# Patient Record
Sex: Male | Born: 1960 | Race: White | Hispanic: No | Marital: Married | Smoking: Current every day smoker
Health system: Southern US, Community
[De-identification: ages and names within clinical notes are randomized; demographics above are authoritative.]

## PROBLEM LIST (undated history)

## (undated) DIAGNOSIS — R790 Abnormal level of blood mineral: Secondary | ICD-10-CM

## (undated) DIAGNOSIS — Z720 Tobacco use: Secondary | ICD-10-CM

## (undated) DIAGNOSIS — C801 Malignant (primary) neoplasm, unspecified: Secondary | ICD-10-CM

## (undated) HISTORY — DX: Tobacco use: Z72.0

## (undated) HISTORY — PX: SURGERY SCROTAL / TESTICULAR: SUR1316

---

## 2001-04-19 ENCOUNTER — Emergency Department (HOSPITAL_COMMUNITY): Admission: EM | Admit: 2001-04-19 | Discharge: 2001-04-19 | Payer: Self-pay

## 2011-10-24 ENCOUNTER — Ambulatory Visit: Payer: BC Managed Care – PPO | Admitting: Internal Medicine

## 2011-10-24 VITALS — BP 118/77 | HR 99 | Temp 98.7°F | Resp 16 | Ht 67.0 in | Wt 130.4 lb

## 2011-10-24 DIAGNOSIS — M545 Low back pain, unspecified: Secondary | ICD-10-CM

## 2011-10-24 MED ORDER — PREDNISONE 10 MG PO TABS: ORAL_TABLET | ORAL | Status: DC

## 2011-10-24 MED ORDER — CYCLOBENZAPRINE HCL 10 MG PO TABS: 10.0000 mg | ORAL_TABLET | Freq: Every day | ORAL | Status: AC

## 2011-10-24 MED ORDER — MELOXICAM 15 MG PO TABS: 15.0000 mg | ORAL_TABLET | Freq: Every day | ORAL | Status: DC

## 2011-10-24 NOTE — Patient Instructions (Signed)
Take medicines and do exercises, handout No lifting over 15-20 pounds at work for the next 2 weeks If not well in 2-3 weeks return for further evaluation

## 2011-10-25 ENCOUNTER — Telehealth: Payer: Self-pay

## 2011-10-25 MED ORDER — HYDROCODONE-ACETAMINOPHEN 5-325 MG PO TABS: 1.0000 | ORAL_TABLET | Freq: Three times a day (TID) | ORAL | Status: AC | PRN

## 2011-10-25 NOTE — Telephone Encounter (Signed)
Patients wife notified, and rx called into [facility]-

## 2011-10-25 NOTE — Telephone Encounter (Signed)
pts wife Personnel officer) called states this is their 3rd call today. States husband is in extreme pain and meds are not helping. Please call pt at [phone]

## 2011-10-25 NOTE — Telephone Encounter (Signed)
pts wife states he is worse today, unable to get out of bed.  Would like someone to call to discuss options.  Medication is not working. Saw Dr. Merla Riches yesterday.  [phone]

## 2011-10-25 NOTE — Progress Notes (Signed)
  Subjective:    Patient ID: Timothy Wade, male    DOB: [birth year], 51 y.o.   MRN: [id number]  HPIA 2 week history of low back pain radiating into the left leg upper posterior thigh No numbness or weakness No prior history of injury Works in custodial position Sleeps well but pain is on arising    Review of SystemsNo illnesses or medications No GU symptoms     Objective:   Physical Exam Vital signs stable He is mildly tender to palpation over the left lumbar sacral muscles  But not over the spinous processes Straight leg raise is positive on the left at 90/negative on the right DTRs are 2+ and symmetrical in the lower Extremities No sensory or motor losses       Assessment & Plan:  Acute lumbosacral strain with radicular symptoms  Heat Stretch/exercises given Flexeril at bedtime Mobic 15 mg in the a.m. Six-day course of prednisone Avoid reinjury at work Recheck in 2 weeks if not well

## 2011-10-25 NOTE — Telephone Encounter (Signed)
A couple of things pts can do.  Increase the flexeril to tid to help with muscle spasms.  Will also send in some pain meds for the next couple of days as the prednisone starts to work.

## 2011-10-28 ENCOUNTER — Encounter: Payer: Self-pay | Admitting: Family Medicine

## 2011-10-28 ENCOUNTER — Ambulatory Visit: Payer: BC Managed Care – PPO | Admitting: Family Medicine

## 2011-10-28 ENCOUNTER — Ambulatory Visit: Payer: BC Managed Care – PPO

## 2011-10-28 VITALS — BP 121/76 | HR 94 | Temp 97.4°F | Resp 16 | Ht 67.0 in | Wt 132.0 lb

## 2011-10-28 DIAGNOSIS — M549 Dorsalgia, unspecified: Secondary | ICD-10-CM

## 2011-10-28 DIAGNOSIS — M5417 Radiculopathy, lumbosacral region: Secondary | ICD-10-CM

## 2011-10-28 DIAGNOSIS — IMO0002 Reserved for concepts with insufficient information to code with codable children: Secondary | ICD-10-CM

## 2011-10-28 MED ORDER — OXYCODONE-ACETAMINOPHEN 5-325 MG PO TABS: 1.0000 | ORAL_TABLET | Freq: Three times a day (TID) | ORAL | Status: AC | PRN

## 2011-10-28 MED ORDER — METHOCARBAMOL 500 MG PO TABS: 500.0000 mg | ORAL_TABLET | Freq: Three times a day (TID) | ORAL | Status: AC

## 2011-10-28 MED ORDER — MELOXICAM 15 MG PO TABS: 15.0000 mg | ORAL_TABLET | Freq: Every day | ORAL | Status: AC

## 2011-10-28 MED ORDER — METHYLPREDNISOLONE 4 MG PO KIT: PACK | ORAL | Status: AC

## 2011-10-28 MED ORDER — TRAMADOL HCL 50 MG PO TABS: 50.0000 mg | ORAL_TABLET | Freq: Three times a day (TID) | ORAL | Status: AC | PRN

## 2011-10-28 NOTE — Progress Notes (Signed)
[facility]:  Office Visit  Chief Complaint:  Chief Complaint  Patient presents with  . Back Pain    low back pain goes into left buttock and left leg x 2 weeks    HPI: Timothy Wade is a 51 y.o. male who complains of  3 week h/o 8-10/10 sharp & throbbing left leg pain associated with numbness/tingling starting starting from left buttock radiating down posterior thigh stopping at knee x 3 weeks. Used to be intermittent, now more constant. Sitting makes it worse, laying down makes it better but he cannot do it for very long. He also feels better when he hunches forward.  No known trauma/injury. Constant tingling. Tried prednsione 30 mg daily without relief since Sunday ( 4 days). Also on Mobic and flexeril. No motor weakness. NO loss of bowel or bladder function/sensation  Past Medical History  Diagnosis Date  . Tobacco abuse    Past Surgical History  Procedure Date  . Surgery scrotal / testicular    History   Social History  . Marital Status: Married    Spouse Name: N/A    Number of Children: N/A  . Years of Education: N/A   Social History Main Topics  . Smoking status: Current Everyday Smoker -- 2.0 packs/day for 30 years  . Smokeless tobacco: None  . Alcohol Use: No  . Drug Use: No  . Sexually Active: None   Other Topics Concern  . None   Social History Narrative  . None   No family history on file. No Known Allergies Prior to Admission medications   Medication Sig Start Date End Date Taking? Authorizing Provider  cyclobenzaprine (FLEXERIL) 10 MG tablet Take 1 tablet (10 mg total) by mouth at bedtime. 10/24/11 11/03/11 Yes Tonye Pearson, MD  HYDROcodone-acetaminophen (NORCO) 5-325 MG per tablet Take 1 tablet by mouth every 8 (eight) hours as needed for pain. 10/25/11 11/04/11 Yes Sarah Harvie Bridge, PA-C  ibuprofen (ADVIL,MOTRIN) 200 MG tablet Take 200 mg by mouth every 6 (six) hours as needed.   Yes Historical Provider, MD  meloxicam (MOBIC) 15 MG  tablet Take 1 tablet (15 mg total) by mouth daily. 10/24/11 10/23/12 Yes Tonye Pearson, MD  predniSONE (DELTASONE) 10 MG tablet 3/3/2/2/1/1Single daily dose for 6 day 10/24/11  Yes Tonye Pearson, MD     ROS: The patient denies fevers, chills, night sweats, unintentional weight loss, chest pain, palpitations, wheezing, dyspnea on exertion, nausea, vomiting, abdominal pain, dysuria, hematuria, melena, weakness,  + pain, numbness,  tingling.   All other systems have been reviewed and were otherwise negative with the exception of those mentioned in the HPI and as above.    PHYSICAL EXAM: Filed Vitals:   10/28/11 1802  BP: 121/76  Pulse: 94  Temp: 97.4 F (36.3 C)  Resp: 16   Filed Vitals:   10/28/11 1802  Height: 5\' 7"  (1.702 m)  Weight: 132 lb (59.875 kg)   Body mass index is 20.67 kg/(m^2).  General: Alert, mild acute distress, thin white male  HEENT:  Normocephalic, atraumatic, oropharynx patent.  Cardiovascular:  Regular rate and rhythm, no rubs murmurs or gallops.  No Carotid bruits, radial pulse intact. No pedal edema.  Respiratory: Clear to auscultation bilaterally.  No rales, or rhonchi.  No cyanosis, no use of accessory musculature. + wheeze GI: No organomegaly, abdomen is soft and non-tender, positive bowel sounds.  No masses. Skin: No rashes. Neurologic: Facial musculature symmetric. Psychiatric: Patient is appropriate throughout our interaction.  Lymphatic: No cervical lymphadenopathy Musculoskeletal: Gait slowed due to pain, no hip abnormalities Lumbar spine: Tender at buttock, no appreciable atrophy + pain with AROM/PROM ( so exam limited) patient cannot ben in extension or flexion > 45 degrees), rotation and SB is limited as well due to pain Strength intact 5/5 + 2 knee, ankle reflex, babinski nl Sensation dull left leg + straight leg on left   LABS: No results found for this or any previous visit.   EKG/XRAY:   Primary read interpreted by Dr. Conley Rolls at  [facility] DJD of low back   ASSESSMENT/PLAN: Encounter Diagnoses  Name Primary?  . Back pain Yes  . Radicular pain of lumbosacral region    ? Herniated disc vs piriformis syndrome vs sciatica Refer patient to Kern Alberta MD ( if patient has no improvement in 2 weeks he will call Dr. Maurice Small himself for appt, if he needs referrral he can call us for it)  Rx Medrol Dose pack , Tramadol, Robaxen, Percocet #21 ( patient aware that I only give 1 time Percocet due to addiction potential, he agrees to use it sparingly, will use Tramadol and MObic for pain control) Stop Flexeril, stop Prednisone since low dose, stop hydrocodone ( I actually took his leftover Hydrocodone in exchange for the Percocet to dipose)     Haylen Shelnutt PHUONG, DO 10/28/2011 6:44 PM

## 2018-06-16 ENCOUNTER — Observation Stay (HOSPITAL_COMMUNITY): Payer: Managed Care, Other (non HMO)

## 2018-06-16 ENCOUNTER — Inpatient Hospital Stay (HOSPITAL_COMMUNITY)
Admission: EM | Admit: 2018-06-16 | Discharge: 2018-06-20 | DRG: 477 | Disposition: A | Discharge status: Home / Self Care | Payer: Managed Care, Other (non HMO) | Attending: Internal Medicine | Admitting: Internal Medicine

## 2018-06-16 ENCOUNTER — Encounter (HOSPITAL_COMMUNITY): Payer: Self-pay | Admitting: *Deleted

## 2018-06-16 DIAGNOSIS — J811 Chronic pulmonary edema: Secondary | ICD-10-CM | POA: Diagnosis present

## 2018-06-16 DIAGNOSIS — M542 Cervicalgia: Secondary | ICD-10-CM | POA: Diagnosis not present

## 2018-06-16 DIAGNOSIS — C7951 Secondary malignant neoplasm of bone: Secondary | ICD-10-CM | POA: Diagnosis not present

## 2018-06-16 DIAGNOSIS — J9601 Acute respiratory failure with hypoxia: Secondary | ICD-10-CM | POA: Diagnosis not present

## 2018-06-16 DIAGNOSIS — F17210 Nicotine dependence, cigarettes, uncomplicated: Secondary | ICD-10-CM | POA: Diagnosis present

## 2018-06-16 DIAGNOSIS — C799 Secondary malignant neoplasm of unspecified site: Secondary | ICD-10-CM | POA: Diagnosis present

## 2018-06-16 DIAGNOSIS — C3490 Malignant neoplasm of unspecified part of unspecified bronchus or lung: Secondary | ICD-10-CM | POA: Diagnosis present

## 2018-06-16 DIAGNOSIS — IMO0002 Reserved for concepts with insufficient information to code with codable children: Secondary | ICD-10-CM

## 2018-06-16 DIAGNOSIS — J439 Emphysema, unspecified: Secondary | ICD-10-CM | POA: Diagnosis present

## 2018-06-16 DIAGNOSIS — Z807 Family history of other malignant neoplasms of lymphoid, hematopoietic and related tissues: Secondary | ICD-10-CM

## 2018-06-16 DIAGNOSIS — R229 Localized swelling, mass and lump, unspecified: Secondary | ICD-10-CM

## 2018-06-16 DIAGNOSIS — R0902 Hypoxemia: Secondary | ICD-10-CM

## 2018-06-16 DIAGNOSIS — R221 Localized swelling, mass and lump, neck: Secondary | ICD-10-CM | POA: Diagnosis present

## 2018-06-16 DIAGNOSIS — Z79899 Other long term (current) drug therapy: Secondary | ICD-10-CM

## 2018-06-16 HISTORY — DX: Abnormal level of blood mineral: R79.0

## 2018-06-16 LAB — BASIC METABOLIC PANEL
Anion gap: 7 (ref 5–15)
BUN: 18 mg/dL (ref 6–20)
CHLORIDE: 101 mmol/L (ref 98–111)
CO2: 26 mmol/L (ref 22–32)
Calcium: 9.5 mg/dL (ref 8.9–10.3)
Creatinine, Ser: 0.95 mg/dL (ref 0.61–1.24)
GFR calc Af Amer: >60 mL/min (ref >60)
GFR calc non Af Amer: >60 mL/min (ref >60)
Glucose, Bld: 96 mg/dL (ref 70–99)
POTASSIUM: 3.1 mmol/L — AB (ref 3.5–5.1)
SODIUM: 134 mmol/L — AB (ref 135–145)

## 2018-06-16 LAB — I-STAT CHEM 8, ED
BUN: 21 mg/dL — AB (ref 6–20)
CHLORIDE: 101 mmol/L (ref 98–111)
CREATININE: 0.8 mg/dL (ref 0.61–1.24)
Calcium, Ion: 1.24 mmol/L (ref 1.15–1.40)
Glucose, Bld: 90 mg/dL (ref 70–99)
HCT: 41 % (ref 39.0–52.0)
Hemoglobin: 13.9 g/dL (ref 13.0–17.0)
POTASSIUM: 3.2 mmol/L — AB (ref 3.5–5.1)
Sodium: 139 mmol/L (ref 135–145)
TCO2: 29 mmol/L (ref 22–32)

## 2018-06-16 LAB — CBC WITH DIFFERENTIAL/PLATELET
ABS IMMATURE GRANULOCYTES: 0.04 10*3/uL (ref 0.00–0.07)
Basophils Absolute: 0.1 10*3/uL (ref 0.0–0.1)
Basophils Relative: 1 %
Eosinophils Absolute: 0.5 10*3/uL (ref 0.0–0.5)
Eosinophils Relative: 5 %
HEMATOCRIT: 42.6 % (ref 39.0–52.0)
HEMOGLOBIN: 14.1 g/dL (ref 13.0–17.0)
IMMATURE GRANULOCYTES: 0 %
LYMPHS ABS: 1.8 10*3/uL (ref 0.7–4.0)
LYMPHS PCT: 19 %
MCH: 31.4 pg (ref 26.0–34.0)
MCHC: 33.1 g/dL (ref 30.0–36.0)
MCV: 94.9 fL (ref 80.0–100.0)
MONOS PCT: 12 %
Monocytes Absolute: 1.1 10*3/uL — ABNORMAL HIGH (ref 0.1–1.0)
NEUTROS ABS: 6.1 10*3/uL (ref 1.7–7.7)
NEUTROS PCT: 63 %
PLATELETS: 285 10*3/uL (ref 150–400)
RBC: 4.49 MIL/uL (ref 4.22–5.81)
RDW: 12.6 % (ref 11.5–15.5)
WBC: 9.7 10*3/uL (ref 4.0–10.5)
nRBC: 0 % (ref 0.0–0.2)

## 2018-06-16 MED ORDER — IOHEXOL 300 MG/ML  SOLN
100.0000 mL | Freq: Once | INTRAMUSCULAR | Status: AC | PRN
  Administered 2018-06-16: 100 mL via INTRAVENOUS

## 2018-06-16 NOTE — ED Notes (Signed)
Neuro surgery at bedside.

## 2018-06-16 NOTE — Consult Note (Signed)
Reason for Consult: Neck pain Referring Physician: Emergency department  Timothy Wade is an 57 y.o. male.  HPI: Patient is a 57 year old male who has had difficulty with progressive neck pain.  The patient has no history of trauma.  He has no history of ongoing illness or infection.  The patient's symptoms have been ongoing for the past few weeks.  He has no symptoms of radiating numbness paresthesias or weakness.  He has some mild feelings of dizziness which he attributes to some of the medications he is taking.  He has no lower cranial nerve dysfunction.  He has no known history of malignancy.  He is a smoker.  He has no recent weight loss.  He has no history of fevers chills.  Past Medical History:  Diagnosis Date  . Low magnesium level   . Tobacco abuse     Past Surgical History:  Procedure Laterality Date  . SURGERY SCROTAL / TESTICULAR      History reviewed. No pertinent family history.  Social History:  reports that he has been smoking. He has a 60.00 pack-year smoking history. He does not have any smokeless tobacco history on file. He reports that he does not drink alcohol or use drugs.  Allergies: No Known Allergies  Medications: I have reviewed the patient's current medications.  Results for orders placed or performed during the hospital encounter of 06/16/18 (from the past 48 hour(s))  CBC with Differential     Status: Abnormal   Collection Time: 06/16/18  8:49 PM  Result Value Ref Range   WBC 9.7 4.0 - 10.5 K/uL   RBC 4.49 4.22 - 5.81 MIL/uL   Hemoglobin 14.1 13.0 - 17.0 g/dL   HCT 42.6 39.0 - 52.0 %   MCV 94.9 80.0 - 100.0 fL   MCH 31.4 26.0 - 34.0 pg   MCHC 33.1 30.0 - 36.0 g/dL   RDW 12.6 11.5 - 15.5 %   Platelets 285 150 - 400 K/uL   nRBC 0.0 0.0 - 0.2 %   Neutrophils Relative % 63 %   Neutro Abs 6.1 1.7 - 7.7 K/uL   Lymphocytes Relative 19 %   Lymphs Abs 1.8 0.7 - 4.0 K/uL   Monocytes Relative 12 %   Monocytes Absolute 1.1 (H) 0.1 - 1.0 K/uL   Eosinophils  Relative 5 %   Eosinophils Absolute 0.5 0.0 - 0.5 K/uL   Basophils Relative 1 %   Basophils Absolute 0.1 0.0 - 0.1 K/uL   Immature Granulocytes 0 %   Abs Immature Granulocytes 0.04 0.00 - 0.07 K/uL    Comment: Performed at [facility], [address], [place], [place] [address]  I-Stat Chem 8, ED     Status: Abnormal   Collection Time: 06/16/18  9:00 PM  Result Value Ref Range   Sodium 139 135 - 145 mmol/L   Potassium 3.2 (L) 3.5 - 5.1 mmol/L   Chloride 101 98 - 111 mmol/L   BUN 21 (H) 6 - 20 mg/dL   Creatinine, Ser 0.80 0.61 - 1.24 mg/dL   Glucose, Bld 90 70 - 99 mg/dL   Calcium, Ion 1.24 1.15 - 1.40 mmol/L   TCO2 29 22 - 32 mmol/L   Hemoglobin 13.9 13.0 - 17.0 g/dL   HCT 41.0 39.0 - 52.0 %    No results found.  Pertinent items noted in HPI and remainder of comprehensive ROS otherwise negative. Blood pressure (!) 139/92, pulse 96, temperature 97.8 F (36.6 C), temperature source Oral, resp.  rate 16, SpO2 96 %. Patient is awake and alert.  He is oriented and appropriate.  Speech is fluent.  Judgment and insight are intact.  Cranial nerve function is normal bilaterally.  No evidence of cerebellar dysfunction.  Motor examination of the extremities normal.  Reflexes normal.  Examination of his head ears eyes no throats unremarkable.  No evidence of pharyngeal mass.  Examination of his neck reveals upper cervical tenderness.  There is no gross bony abnormality.  Chest and abdomen are benign.  Assessment/Plan: Destructive lesion involving C2 and C3.  Imaging most consistent with neoplastic disease although atypical infection is certainly a possibility.  Patient has no evidence of spinal cord compression.  Overall vertebral alignment is reasonably normal.  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  It may also represent metastatic disease.  I think the patient should be evaluated for possible multiple myeloma with serum and urine electrophoresis.  He should  also have a CT scan of his neck chest and abdomen to evaluate for possible metastatic disease or other bony lesions.  No indications for urgent surgical intervention.  If this does represent a plasmacytoma then radiation treatment would be the primary treatment at present.  Timothy Wade Day 06/16/2018, 9:05 PM

## 2018-06-16 NOTE — ED Triage Notes (Signed)
Pt reports having neck pain for months, has numbness/tingling to right side. Had xray done, then sent for MRI today. Was called by pcp this evening to come to ed for admission by dr Trenton Gammon. Was told he has "c2 cartilage destruction". Has c collar in place pta.

## 2018-06-16 NOTE — ED Notes (Signed)
The pt reports that he has had neck pain for one month.  He has a soft c-collar in place no new injuries

## 2018-06-16 NOTE — ED Provider Notes (Signed)
[facility] EMERGENCY DEPARTMENT Provider Note   CSN: [id number] Arrival date & time: 06/16/18  1853     History   Chief Complaint Chief Complaint  Patient presents with  . Neck Pain    HPI Timothy Wade is a 57 y.o. male.  57 yo M with a chief complaint of neck pain.  This is been going on for months.  He started having tingling to his right lateral leg and so he went to his family doctor who obtained an MRI.  That showed a destructive process at C2 and C3.  There is some extrusion into the epidural space.  He was then sent here by his family doctor for evaluation by neurosurgery.  The history is provided by the patient.  Neck Pain   This is a new problem. The current episode started 2 days ago. The problem occurs constantly. The problem has not changed since onset.The pain is associated with nothing. There has been no fever. Pain location: lower neck. The quality of the pain is described as stabbing and shooting. The pain does not radiate. The pain is at a severity of 7/10. The pain is moderate. Associated symptoms include numbness (to the lateral aspect of the right leg). Pertinent negatives include no chest pain and no headaches. He has tried neck support for the symptoms. The treatment provided no relief.    Past Medical History:  Diagnosis Date  . Low magnesium level   . Tobacco abuse     Patient Active Problem List   Diagnosis Date Noted  . Neck pain 06/16/2018    Past Surgical History:  Procedure Laterality Date  . SURGERY SCROTAL / TESTICULAR          Home Medications    Prior to Admission medications   Medication Sig Start Date End Date Taking? Authorizing Provider  cyclobenzaprine (FLEXERIL) 10 MG tablet Take 10 mg by mouth every 12 (twelve) hours as needed for muscle spasms.   Yes [provider]  magnesium oxide (MAG-OX) 400 MG tablet Take 400 mg by mouth daily.   Yes [provider]  naproxen (NAPROSYN) 500 MG tablet  Take 500 mg by mouth every 12 (twelve) hours as needed for mild pain.   Yes [provider]  predniSONE (DELTASONE) 10 MG tablet 3/3/2/2/1/1Single daily dose for 6 day Patient not taking: Reported on 06/16/2018 10/24/11   Leandrew Koyanagi, MD    Family History History reviewed. No pertinent family history.  Social History Social History   Tobacco Use  . Smoking status: Current Every Day Smoker    Packs/day: 2.00    Years: 30.00    Pack years: 60.00  Substance Use Topics  . Alcohol use: No  . Drug use: No     Allergies   Patient has no known allergies.   Review of Systems Review of Systems  Constitutional: Negative for chills and fever.  HENT: Negative for congestion and facial swelling.   Eyes: Negative for discharge and visual disturbance.  Respiratory: Negative for shortness of breath.   Cardiovascular: Negative for chest pain and palpitations.  Gastrointestinal: Negative for abdominal pain, diarrhea and vomiting.  Musculoskeletal: Positive for neck pain. Negative for arthralgias and myalgias.  Skin: Negative for color change and rash.  Neurological: Positive for numbness (to the lateral aspect of the right leg). Negative for tremors, syncope and headaches.  Psychiatric/Behavioral: Negative for confusion and dysphoric mood.     Physical Exam Updated Vital Signs BP (!) 139/92 (BP  Location: Right Arm)   Pulse 96   Temp 97.8 F (36.6 C) (Oral)   Resp 16   SpO2 96%   Physical Exam  Constitutional: He is oriented to person, place, and time. He appears well-developed and well-nourished.  HENT:  Head: Normocephalic and atraumatic.  Eyes: Pupils are equal, round, and reactive to light. EOM are normal.  Neck: Normal range of motion. Neck supple. No JVD present.  Cardiovascular: Normal rate and regular rhythm. Exam reveals no gallop and no friction rub.  No murmur heard. Pulmonary/Chest: No respiratory distress. He has no wheezes.  Abdominal: He exhibits  no distension and no mass. There is no tenderness. There is no rebound and no guarding.  Musculoskeletal: Normal range of motion.  Pulse motor and sensation is intact to bilateral upper extremities.  He has intact strength.  No lower extremity weakness reflexes are 2+ and equal bilaterally.  Neurological: He is alert and oriented to person, place, and time.  Skin: No rash noted. No pallor.  Psychiatric: He has a normal mood and affect. His behavior is normal.  Nursing note and vitals reviewed.    ED Treatments / Results  Labs (all labs ordered are listed, but only abnormal results are displayed) Labs Reviewed  CBC WITH DIFFERENTIAL/PLATELET - Abnormal; Notable for the following components:      Result Value   Monocytes Absolute 1.1 (*)    All other components within normal limits  BASIC METABOLIC PANEL - Abnormal; Notable for the following components:   Sodium 134 (*)    Potassium 3.1 (*)    All other components within normal limits  I-STAT CHEM 8, ED - Abnormal; Notable for the following components:   Potassium 3.2 (*)    BUN 21 (*)    All other components within normal limits    EKG None  Radiology Ct Cervical Spine Wo Contrast  Result Date: 06/16/2018 CLINICAL DATA:  Neck pain months.  Right-sided numbness and tingling EXAM: CT CERVICAL SPINE WITHOUT CONTRAST TECHNIQUE: Multidetector CT imaging of the cervical spine was performed without intravenous contrast. Multiplanar CT image reconstructions were also generated. COMPARISON:  Cervical spine MRI 06/16/2018 FINDINGS: Alignment: There is grade 1 retrolisthesis at the C2-C3 level. Alignment is otherwise normal. Skull base and vertebrae: There is a destructive lesion of the lower aspect of the C2 vertebral body that extends into the right-sided posterior elements. On axial images, the soft tissue mass measures 3.3 cm transverse by 2.9 cm AP. There is retropulsion/epidural extension as demonstrated on the concomitant MRI. The mass  extends into the right neural foramen there is also destruction of the right posterosuperior corner of C3. No lower cervical vertebral lesions. Soft tissues and spinal canal: There is soft tissue thickening anterior to C2. Disc levels: Disc space narrowing is greatest at C2-3 and C6-7. As above, there is retropulsion at C2. This effaces the ventral thecal sac and causes mild spinal canal stenosis. Upper chest: Biapical emphysema. Other: None IMPRESSION: 1. Soft tissue mass of the lower aspect of C2, with associated osseous destruction, most consistent with plasmacytoma or metastasis. 4 mm of retropulsion/posterior epidural extension with associated mild spinal canal stenosis, better characterized on concomitant MRI. The mass extends into the right neural foramen. 2. Mild destruction of the posterosuperior corner of C3. 3. Biapical emphysema.  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 Electronically Signed   By: Ulyses Jarred M.D.   On: 06/16/2018 23:46    Procedures Procedures (including critical care time)  Medications Ordered in ED Medications  iohexol (OMNIPAQUE)  300 MG/ML solution 100 mL (100 mLs Intravenous Contrast Given 06/16/18 2307)     Initial Impression / Assessment and Plan / ED Course  I have reviewed the triage vital signs and the nursing notes.  Pertinent labs & imaging results that were available during my care of the patient were reviewed by me and considered in my medical decision making (see chart for details).     57 yo M with a chief complaint of neck pain.  This been going on for some time but started having paresthesias to his right leg.  Went to see his family doctor who obtained an MRI that showed a destructive process at C2.  He was then sent here for neurosurgery evaluation.  I discussed the case with neurosurgery, Megan covering for Dr. Trenton Gammon.  Plan for the patient is hospitalist admission for new cancer evaluation.   The patients results and plan were reviewed and discussed.   Any x-rays  performed were independently reviewed by myself.   Differential diagnosis were considered with the presenting HPI.  Medications  iohexol (OMNIPAQUE) 300 MG/ML solution 100 mL (100 mLs Intravenous Contrast Given 06/16/18 2307)    Vitals:   06/16/18 1902  BP: (!) 139/92  Pulse: 96  Resp: 16  Temp: 97.8 F (36.6 C)  TempSrc: Oral  SpO2: 96%    Final diagnoses:  Neck mass    Admission/ observation were discussed with the admitting physician, patient and/or family and they are comfortable with the plan.    Final Clinical Impressions(s) / ED Diagnoses   Final diagnoses:  Neck mass    ED Discharge Orders    None       Deno Etienne, DO 06/16/18 2352

## 2018-06-17 ENCOUNTER — Encounter (HOSPITAL_COMMUNITY): Payer: Self-pay | Admitting: Emergency Medicine

## 2018-06-17 ENCOUNTER — Other Ambulatory Visit: Payer: Self-pay

## 2018-06-17 DIAGNOSIS — R221 Localized swelling, mass and lump, neck: Secondary | ICD-10-CM | POA: Diagnosis not present

## 2018-06-17 DIAGNOSIS — M542 Cervicalgia: Secondary | ICD-10-CM

## 2018-06-17 LAB — HEPATIC FUNCTION PANEL
ALT: 11 U/L (ref 0–44)
AST: 16 U/L (ref 15–41)
Albumin: 3.8 g/dL (ref 3.5–5.0)
Alkaline Phosphatase: 93 U/L (ref 38–126)
BILIRUBIN DIRECT: 0.1 mg/dL (ref 0.0–0.2)
Indirect Bilirubin: 0.7 mg/dL (ref 0.3–0.9)
TOTAL PROTEIN: 6.9 g/dL (ref 6.5–8.1)
Total Bilirubin: 0.8 mg/dL (ref 0.3–1.2)

## 2018-06-17 LAB — BASIC METABOLIC PANEL
ANION GAP: 9 (ref 5–15)
BUN: 15 mg/dL (ref 6–20)
CALCIUM: 9.5 mg/dL (ref 8.9–10.3)
CO2: 26 mmol/L (ref 22–32)
Chloride: 101 mmol/L (ref 98–111)
Creatinine, Ser: 0.95 mg/dL (ref 0.61–1.24)
GFR calc non Af Amer: >60 mL/min (ref >60)
GLUCOSE: 93 mg/dL (ref 70–99)
Potassium: 3.1 mmol/L — ABNORMAL LOW (ref 3.5–5.1)
Sodium: 136 mmol/L (ref 135–145)

## 2018-06-17 LAB — CBC
HCT: 42.4 % (ref 39.0–52.0)
Hemoglobin: 14.5 g/dL (ref 13.0–17.0)
MCH: 31.7 pg (ref 26.0–34.0)
MCHC: 34.2 g/dL (ref 30.0–36.0)
MCV: 92.8 fL (ref 80.0–100.0)
PLATELETS: 284 10*3/uL (ref 150–400)
RBC: 4.57 MIL/uL (ref 4.22–5.81)
RDW: 12.6 % (ref 11.5–15.5)
WBC: 9.9 10*3/uL (ref 4.0–10.5)
nRBC: 0 % (ref 0.0–0.2)

## 2018-06-17 LAB — HIV ANTIBODY (ROUTINE TESTING W REFLEX): HIV Screen 4th Generation wRfx: NONREACTIVE

## 2018-06-17 MED ORDER — POTASSIUM CHLORIDE CRYS ER 20 MEQ PO TBCR
40.0000 meq | EXTENDED_RELEASE_TABLET | Freq: Once | ORAL | Status: AC
  Administered 2018-06-17: 40 meq via ORAL
  Filled 2018-06-17: qty 2

## 2018-06-17 MED ORDER — ACETAMINOPHEN 325 MG PO TABS: 650.0000 mg | ORAL_TABLET | Freq: Four times a day (QID) | ORAL | Status: DC | PRN

## 2018-06-17 MED ORDER — ACETAMINOPHEN 650 MG RE SUPP: 650.0000 mg | Freq: Four times a day (QID) | RECTAL | Status: DC | PRN

## 2018-06-17 MED ORDER — TRAZODONE HCL 50 MG PO TABS
50.0000 mg | ORAL_TABLET | Freq: Once | ORAL | Status: AC
  Administered 2018-06-17: 50 mg via ORAL
  Filled 2018-06-17: qty 1

## 2018-06-17 MED ORDER — ONDANSETRON HCL 4 MG PO TABS: 4.0000 mg | ORAL_TABLET | Freq: Four times a day (QID) | ORAL | Status: DC | PRN

## 2018-06-17 MED ORDER — MAGNESIUM OXIDE 400 (241.3 MG) MG PO TABS
400.0000 mg | ORAL_TABLET | Freq: Every day | ORAL | Status: DC
  Administered 2018-06-17 – 2018-06-20 (×4): 400 mg via ORAL
  Filled 2018-06-17 (×4): qty 1

## 2018-06-17 MED ORDER — ONDANSETRON HCL 4 MG/2ML IJ SOLN: 4.0000 mg | Freq: Four times a day (QID) | INTRAMUSCULAR | Status: DC | PRN

## 2018-06-17 MED ORDER — MORPHINE SULFATE (PF) 2 MG/ML IV SOLN
1.0000 mg | INTRAVENOUS | Status: DC | PRN
  Administered 2018-06-17 – 2018-06-18 (×2): 1 mg via INTRAVENOUS
  Filled 2018-06-17 (×3): qty 1

## 2018-06-17 MED ORDER — CYCLOBENZAPRINE HCL 10 MG PO TABS
10.0000 mg | ORAL_TABLET | Freq: Three times a day (TID) | ORAL | Status: DC | PRN
  Administered 2018-06-17 – 2018-06-20 (×8): 10 mg via ORAL
  Filled 2018-06-17 (×8): qty 1

## 2018-06-17 MED ORDER — HYDROCODONE-ACETAMINOPHEN 5-325 MG PO TABS
1.0000 | ORAL_TABLET | Freq: Four times a day (QID) | ORAL | Status: DC | PRN
  Administered 2018-06-17 – 2018-06-18 (×3): 1 via ORAL
  Filled 2018-06-17 (×3): qty 1

## 2018-06-17 NOTE — Progress Notes (Signed)
CT scans from last night reviewed.  Findings worrisome for widely disseminated carcinoma.  I would recommend biopsy of 1 of the more peripheral lesions.  Most likely will require palliative radiation of the cervical spine.  Continue cervical collar.

## 2018-06-17 NOTE — H&P (Signed)
History and Physical    Timothy Wade GEZ:[id number] DOB: [birth year] DOA: 06/16/2018  PCP: Barnie Mort, NP  Patient coming from: Home.  Chief Complaint: Neck pain and abnormal MRI.  HPI: Timothy Wade is a 57 y.o. male with history of tobacco abuse has been experiencing neck pain for the last 1 month.  Had MRI of the C-spine done as outpatient which showed destructive lesion of the C2-C3 concerning for malignancy or metastatic disease.  Patient was referred to the ER.  Denies any chest pain shortness of breath or any weakness of upper or lower extremities or any incontinence of urine or bowel.  ED Course: In the ER patient was evaluated by neurosurgery and advised to get a metastatic work-up and admitted for further observation.  CT scan of the chest abdomen and pelvis and C-spine were done which shows mass in the neck area with widespread metastatic disease.  Review of Systems: As per HPI, rest all negative.   Past Medical History:  Diagnosis Date  . Low magnesium level   . Tobacco abuse     Past Surgical History:  Procedure Laterality Date  . SURGERY SCROTAL / TESTICULAR       reports that he has been smoking. He has a 60.00 pack-year smoking history. He has never used smokeless tobacco. He reports that he does not drink alcohol or use drugs.  No Known Allergies  Family History  Problem Relation Age of Onset  . Lymphoma Brother     Prior to Admission medications   Medication Sig Start Date End Date Taking? Authorizing Provider  cyclobenzaprine (FLEXERIL) 10 MG tablet Take 10 mg by mouth every 12 (twelve) hours as needed for muscle spasms.   Yes [provider]  magnesium oxide (MAG-OX) 400 MG tablet Take 400 mg by mouth daily.   Yes [provider]  naproxen (NAPROSYN) 500 MG tablet Take 500 mg by mouth every 12 (twelve) hours as needed for mild pain.   Yes [provider]  predniSONE (DELTASONE) 10 MG tablet 3/3/2/2/1/1Single daily dose for  6 day Patient not taking: Reported on 06/16/2018 10/24/11   Leandrew Koyanagi, MD    Physical Exam: Vitals:   06/16/18 1902 06/16/18 2353  BP: (!) 139/92 (!) 158/98  Pulse: 96 94  Resp: 16   Temp: 97.8 F (36.6 C) 98.8 F (37.1 C)  TempSrc: Oral Oral  SpO2: 96% 98%  Weight:  60 kg  Height:  '5\' 7"'  (1.702 m)      Constitutional: Moderately built and nourished. Vitals:   06/16/18 1902 06/16/18 2353  BP: (!) 139/92 (!) 158/98  Pulse: 96 94  Resp: 16   Temp: 97.8 F (36.6 C) 98.8 F (37.1 C)  TempSrc: Oral Oral  SpO2: 96% 98%  Weight:  60 kg  Height:  '5\' 7"'  (1.702 m)   Eyes: Anicteric no pallor. ENMT: No discharge from the ears eyes nose or mouth. Neck: On neck collar. Respiratory: No rhonchi or crepitations. Cardiovascular: S1-S2 heard. Abdomen: Soft nontender bowel sounds present. Musculoskeletal: No edema. Skin: No rash. Neurologic: Alert awake oriented to time place and person.  Moves all extremities. Psychiatric: Appears normal.  Normal affect.   Labs on Admission: I have personally reviewed following labs and imaging studies  CBC: Recent Labs  Lab 06/16/18 2049 06/16/18 2100  WBC 9.7  --   NEUTROABS 6.1  --   HGB 14.1 13.9  HCT 42.6 41.0  MCV 94.9  --   PLT 285  --  Basic Metabolic Panel: Recent Labs  Lab 06/16/18 2049 06/16/18 2100  NA 134* 139  K 3.1* 3.2*  CL 101 101  CO2 26  --   GLUCOSE 96 90  BUN 18 21*  CREATININE 0.95 0.80  CALCIUM 9.5  --    GFR: Estimated Creatinine Clearance: 87.5 mL/min (by C-G formula based on SCr of 0.8 mg/dL). Liver Function Tests: No results for input(s): AST, ALT, ALKPHOS, BILITOT, PROT, ALBUMIN in the last 168 hours. No results for input(s): LIPASE, AMYLASE in the last 168 hours. No results for input(s): AMMONIA in the last 168 hours. Coagulation Profile: No results for input(s): INR, PROTIME in the last 168 hours. Cardiac Enzymes: No results for input(s): CKTOTAL, CKMB, CKMBINDEX, TROPONINI in  the last 168 hours. BNP (last 3 results) No results for input(s): PROBNP in the last 8760 hours. HbA1C: No results for input(s): HGBA1C in the last 72 hours. CBG: No results for input(s): GLUCAP in the last 168 hours. Lipid Profile: No results for input(s): CHOL, HDL, LDLCALC, TRIG, CHOLHDL, LDLDIRECT in the last 72 hours. Thyroid Function Tests: No results for input(s): TSH, T4TOTAL, FREET4, T3FREE, THYROIDAB in the last 72 hours. Anemia Panel: No results for input(s): VITAMINB12, FOLATE, FERRITIN, TIBC, IRON, RETICCTPCT in the last 72 hours. Urine analysis: No results found for: COLORURINE, APPEARANCEUR, LABSPEC, PHURINE, GLUCOSEU, HGBUR, BILIRUBINUR, KETONESUR, PROTEINUR, UROBILINOGEN, NITRITE, LEUKOCYTESUR Sepsis Labs: '@[email or web address]' (procalcitonin:4,lacticidven:4) )No results found for this or any previous visit (from the past 240 hour(s)).   Radiological Exams on Admission: Ct Chest W Contrast  Result Date: 06/16/2018 CLINICAL DATA:  Bones/soft tissue sarcoma, metastasis screening. Neck pain for months. EXAM: CT CHEST, ABDOMEN, AND PELVIS WITH CONTRAST TECHNIQUE: Multidetector CT imaging of the chest, abdomen and pelvis was performed following the standard protocol during bolus administration of intravenous contrast. CONTRAST:  169m OMNIPAQUE IOHEXOL 300 MG/ML  SOLN COMPARISON:  None. FINDINGS: CT CHEST FINDINGS Cardiovascular: Heart size is normal. No pericardial effusion. No thoracic aortic aneurysm. Mild aortic atherosclerosis. Diffuse coronary artery calcifications, particularly dense within the LEFT anterior descending coronary artery. Mediastinum/Nodes: Enlarged and morphologically abnormal lymph nodes are seen within the mediastinum and perihilar regions, consistent with metastasis. This includes a low-density enlarged lymph node in the RIGHT hilum, measuring 2.1 cm short axis (series 3, image 41), an enlarged lymph node in the subcarinal space measuring 2.1 cm short axis (series 3,  image 39) and a morphologically abnormal low-density lymph node within the RIGHT lower paratracheal space measuring 1.3 cm short axis (series 3, image 28). Esophagus is unremarkable. Trachea appears normal. Lungs/Pleura: Advanced emphysematous changes bilaterally, upper lobe predominant. 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 Spiculated pulmonary nodule within the RIGHT lower lobe measuring 9 mm (series 5, image 95). Prominent parabronchial thickening involving the bronchial segments to the RIGHT lower lobe, likely lymphangitic extension of the perihilar lymph node metastases. Associated narrowing of the RIGHT lower lobe and middle lobe bronchi, without occlusion. Musculoskeletal: Soft tissue mass involving the upper RIGHT scapula, extending into the lower neck, measuring 5.5 x 5.4 cm (series 3, image 8). Additional neoplastic masses involving the posterior RIGHT sixth and seventh ribs, measuring 2.9 cm and 3 cm respectively (series 3, image 29). Additional neoplastic mass involving the posterior RIGHT tenth rib, measuring 4 cm (series 3, image 48). CT ABDOMEN PELVIS FINDINGS Hepatobiliary: No focal liver abnormality is seen. No gallstones, gallbladder wall thickening, or biliary dilatation. Pancreas: Unremarkable. No pancreatic ductal dilatation or surrounding inflammatory changes. Spleen: Normal in size  without focal abnormality. Adrenals/Urinary Tract: RIGHT adrenal mass measures 1.7 cm. LEFT adrenal gland is enlarged without discrete mass. Kidneys are unremarkable without mass, stone or hydronephrosis. Bladder appears normal. Stomach/Bowel: No dilated large or small bowel loops. No evidence of focal bowel wall thickening or bowel wall inflammation. Appendix is normal. Stomach is unremarkable. Vascular/Lymphatic: Aortic atherosclerosis. No acute appearing vascular abnormality. No enlarged lymph nodes appreciated. Mildly prominent lymph nodes within  the periaortic retroperitoneum are suspicious for additional metastatic disease. Reproductive: Prostate is unremarkable. Other: No free fluid or abscess collection. No free intraperitoneal air. Musculoskeletal: Neoplastic soft tissue mass involving the RIGHT lateral half of the L4 vertebral body, with probable associated pathologic fractures, the soft tissue component measuring 4.7 x 4.7 cm (series 3, image 87). The mass involves the RIGHT pedicle. No convincing extension of the mass into the central canal. Mass does involve the RIGHT neural foramen with probable associated nerve root impingement. IMPRESSION: 1. Soft tissue mass involving the upper RIGHT scapula, extending into the lower neck, measuring 5.5 x 5.4 cm, consistent with metastatic disease. 2. Additional destructive neoplastic masses involving the posterior RIGHT sixth and seventh ribs, measuring 2.9 cm and 3 cm respectively. 3. Additional destructive neoplastic mass involving the posterior RIGHT tenth rib, measuring 4 cm. 4. Enlarged and morphologically abnormal lymph nodes within the mediastinum and perihilar regions, consistent with metastatic disease. 5. Spiculated pulmonary nodule within the RIGHT lower lobe, measuring 9 mm, also suspicious for metastatic disease. 6. Prominent parabronchial thickening involving the bronchial segments to the RIGHT lower lobe, likely lymphangitic extension of the perihilar lymph node metastases. Associated narrowing of the RIGHT lower lobe and middle lobe bronchi, without occlusion. 7. Mildly prominent lymph nodes within the periaortic retroperitoneum, 1 of which lies posterior to the IVC and is highly suspicious for additional metastatic disease. 8. RIGHT adrenal mass measuring 1.7 cm, suspicious for adrenal metastasis. 9. Neoplastic soft tissue mass involving the RIGHT lateral half of the L4 vertebral body, with associated destruction of the vertebral body and with probable associated pathologic fractures, measuring  approximately 4 cm greatest dimension. This mass involves the proximal portion of the RIGHT pedicle and extends to the RIGHT neural foramen with probable associated nerve root compression. 10. Advanced emphysema. 11. Asymmetric interstitial prominence within the RIGHT upper lobe, suspicious for lymphangitis carcinomatosis. 12. Diffuse coronary artery calcifications, particularly dense within the LEFT anterior descending coronary artery. Aortic Atherosclerosis (ICD10-I70.0) and Emphysema (ICD10-J43.9). Electronically Signed   By: Franki Cabot M.D.   On: 06/16/2018 23:50   Ct Cervical Spine Wo Contrast  Result Date: 06/16/2018 CLINICAL DATA:  Neck pain months.  Right-sided numbness and tingling EXAM: CT CERVICAL SPINE WITHOUT CONTRAST TECHNIQUE: Multidetector CT imaging of the cervical spine was performed without intravenous contrast. Multiplanar CT image reconstructions were also generated. COMPARISON:  Cervical spine MRI 06/16/2018 FINDINGS: Alignment: There is grade 1 retrolisthesis at the C2-C3 level. Alignment is otherwise normal. Skull base and vertebrae: There is a destructive lesion of the lower aspect of the C2 vertebral body that extends into the right-sided posterior elements. On axial images, the soft tissue mass measures 3.3 cm transverse by 2.9 cm AP. There is retropulsion/epidural extension as demonstrated on the concomitant MRI. The mass extends into the right neural foramen there is also destruction of the right posterosuperior corner of C3. No lower cervical vertebral lesions. Soft tissues and spinal canal: There is soft tissue thickening anterior to C2. Disc levels: Disc space narrowing is greatest at C2-3 and C6-7. As above,  there is retropulsion at C2. This effaces the ventral thecal sac and causes mild spinal canal stenosis. Upper chest: Biapical emphysema. Other: None IMPRESSION: 1. Soft tissue mass of the lower aspect of C2, with associated osseous destruction, most consistent with  plasmacytoma or metastasis. 4 mm of retropulsion/posterior epidural extension with associated mild spinal canal stenosis, better characterized on concomitant MRI. The mass extends into the right neural foramen. 2. Mild destruction of the posterosuperior corner of C3. 3. Biapical emphysema.  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 Electronically Signed   By: Ulyses Jarred M.D.   On: 06/16/2018 23:46   Ct Abdomen Pelvis W Contrast  Result Date: 06/16/2018 CLINICAL DATA:  Bones/soft tissue sarcoma, metastasis screening. Neck pain for months. EXAM: CT CHEST, ABDOMEN, AND PELVIS WITH CONTRAST TECHNIQUE: Multidetector CT imaging of the chest, abdomen and pelvis was performed following the standard protocol during bolus administration of intravenous contrast. CONTRAST:  160m OMNIPAQUE IOHEXOL 300 MG/ML  SOLN COMPARISON:  None. FINDINGS: CT CHEST FINDINGS Cardiovascular: Heart size is normal. No pericardial effusion. No thoracic aortic aneurysm. Mild aortic atherosclerosis. Diffuse coronary artery calcifications, particularly dense within the LEFT anterior descending coronary artery. Mediastinum/Nodes: Enlarged and morphologically abnormal lymph nodes are seen within the mediastinum and perihilar regions, consistent with metastasis. This includes a low-density enlarged lymph node in the RIGHT hilum, measuring 2.1 cm short axis (series 3, image 41), an enlarged lymph node in the subcarinal space measuring 2.1 cm short axis (series 3, image 39) and a morphologically abnormal low-density lymph node within the RIGHT lower paratracheal space measuring 1.3 cm short axis (series 3, image 28). Esophagus is unremarkable. Trachea appears normal. Lungs/Pleura: Advanced emphysematous changes bilaterally, upper lobe predominant. 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 Spiculated pulmonary nodule within the RIGHT lower lobe measuring 9 mm (series 5, image 95). Prominent parabronchial thickening  involving the bronchial segments to the RIGHT lower lobe, likely lymphangitic extension of the perihilar lymph node metastases. Associated narrowing of the RIGHT lower lobe and middle lobe bronchi, without occlusion. Musculoskeletal: Soft tissue mass involving the upper RIGHT scapula, extending into the lower neck, measuring 5.5 x 5.4 cm (series 3, image 8). Additional neoplastic masses involving the posterior RIGHT sixth and seventh ribs, measuring 2.9 cm and 3 cm respectively (series 3, image 29). Additional neoplastic mass involving the posterior RIGHT tenth rib, measuring 4 cm (series 3, image 48). CT ABDOMEN PELVIS FINDINGS Hepatobiliary: No focal liver abnormality is seen. No gallstones, gallbladder wall thickening, or biliary dilatation. Pancreas: Unremarkable. No pancreatic ductal dilatation or surrounding inflammatory changes. Spleen: Normal in size without focal abnormality. Adrenals/Urinary Tract: RIGHT adrenal mass measures 1.7 cm. LEFT adrenal gland is enlarged without discrete mass. Kidneys are unremarkable without mass, stone or hydronephrosis. Bladder appears normal. Stomach/Bowel: No dilated large or small bowel loops. No evidence of focal bowel wall thickening or bowel wall inflammation. Appendix is normal. Stomach is unremarkable. Vascular/Lymphatic: Aortic atherosclerosis. No acute appearing vascular abnormality. No enlarged lymph nodes appreciated. Mildly prominent lymph nodes within the periaortic retroperitoneum are suspicious for additional metastatic disease. Reproductive: Prostate is unremarkable. Other: No free fluid or abscess collection. No free intraperitoneal air. Musculoskeletal: Neoplastic soft tissue mass involving the RIGHT lateral half of the L4 vertebral body, with probable associated pathologic fractures, the soft tissue component measuring 4.7 x 4.7 cm (series 3, image 87). The mass involves the RIGHT pedicle. No convincing extension of the mass into the central canal. Mass  does involve the RIGHT neural foramen with probable associated nerve root  impingement. IMPRESSION: 1. Soft tissue mass involving the upper RIGHT scapula, extending into the lower neck, measuring 5.5 x 5.4 cm, consistent with metastatic disease. 2. Additional destructive neoplastic masses involving the posterior RIGHT sixth and seventh ribs, measuring 2.9 cm and 3 cm respectively. 3. Additional destructive neoplastic mass involving the posterior RIGHT tenth rib, measuring 4 cm. 4. Enlarged and morphologically abnormal lymph nodes within the mediastinum and perihilar regions, consistent with metastatic disease. 5. Spiculated pulmonary nodule within the RIGHT lower lobe, measuring 9 mm, also suspicious for metastatic disease. 6. Prominent parabronchial thickening involving the bronchial segments to the RIGHT lower lobe, likely lymphangitic extension of the perihilar lymph node metastases. Associated narrowing of the RIGHT lower lobe and middle lobe bronchi, without occlusion. 7. Mildly prominent lymph nodes within the periaortic retroperitoneum, 1 of which lies posterior to the IVC and is highly suspicious for additional metastatic disease. 8. RIGHT adrenal mass measuring 1.7 cm, suspicious for adrenal metastasis. 9. Neoplastic soft tissue mass involving the RIGHT lateral half of the L4 vertebral body, with associated destruction of the vertebral body and with probable associated pathologic fractures, measuring approximately 4 cm greatest dimension. This mass involves the proximal portion of the RIGHT pedicle and extends to the RIGHT neural foramen with probable associated nerve root compression. 10. Advanced emphysema. 11. Asymmetric interstitial prominence within the RIGHT upper lobe, suspicious for lymphangitis carcinomatosis. 12. Diffuse coronary artery calcifications, particularly dense within the LEFT anterior descending coronary artery. Aortic Atherosclerosis (ICD10-I70.0) and Emphysema (ICD10-J43.9).  Electronically Signed   By: Franki Cabot M.D.   On: 06/16/2018 23:50     Assessment/Plan Active Problems:   Neck pain   Neck mass    1. Neck mass with destructive lesions and widespread metastatic disease seen in the CAT scan of the chest and abdomen -will probably need biopsy will await neurosurgery recommendation if they are going to biopsy the neck mass otherwise will need interventional radiology consult for biopsy.  Since that is concerning for plasmacytoma/multiple myeloma serum protein electrophoresis and urine immunofixation has been ordered. 2. Tobacco abuse -tobacco cessation counseling.   DVT prophylaxis: SCDs.  In anticipation of procedure. Code Status: Full code. Family Communication: Discussed with patient. Disposition Plan: Home. Consults called: Neurosurgery. Admission status: Observation.   Rise Patience MD [facility] Pager [phone].  If 7PM-7AM, please contact night-coverage [email or web address] Password [facility]  06/17/2018, 12:59 AM

## 2018-06-17 NOTE — Consult Note (Signed)
Chief Complaint: Patient was seen in consultation today for right scapular lesion biopsy Chief Complaint  Patient presents with  . Neck Pain   at the request of Dr Princella Ion   Supervising Physician: Daryll Brod  Patient Status: [facility] - [facility]-pt  History of Present Illness: Timothy Wade is a 57 y.o. male   Neck pain for a month Worsening No trauma No wt loss  CT yesterday:  IMPRESSION: 1. Soft tissue mass of the lower aspect of C2, with associated osseous destruction, most consistent with plasmacytoma or metastasis. 4 mm of retropulsion/posterior epidural extension with associated mild spinal canal stenosis, better characterized on concomitant MRI. The mass extends into the right neural foramen. 2. Mild destruction of the posterosuperior corner of C3. 3. Biapical emphysema.   IMPRESSION: 1. Soft tissue mass involving the upper RIGHT scapula, extending into the lower neck, measuring 5.5 x 5.4 cm, consistent with metastatic disease. 2. Additional destructive neoplastic masses involving the posterior RIGHT sixth and seventh ribs, measuring 2.9 cm and 3 cm respectively. 3. Additional destructive neoplastic mass involving the posterior RIGHT tenth rib, measuring 4 cm. 4. Enlarged and morphologically abnormal lymph nodes within the mediastinum and perihilar regions, consistent with metastatic disease. 5. Spiculated pulmonary nodule within the RIGHT lower lobe, measuring 9 mm, also suspicious for metastatic disease. 6. Prominent parabronchial thickening involving the bronchial segments to the RIGHT lower lobe, likely lymphangitic extension of the perihilar lymph node metastases. Associated narrowing of the RIGHT lower lobe and middle lobe bronchi, without occlusion. 7. Mildly prominent lymph nodes within the periaortic retroperitoneum, 1 of which lies posterior to the IVC and is highly suspicious for additional metastatic disease. 8. RIGHT adrenal mass measuring 1.7 cm,  suspicious for adrenal metastasis. 9. Neoplastic soft tissue mass involving the RIGHT lateral half of the L4 vertebral body, with associated destruction of the vertebral body and with probable associated pathologic fractures, measuring approximately 4 cm greatest dimension. This mass involves the proximal portion of the RIGHT pedicle and extends to the RIGHT neural foramen with probable associated nerve root compression. 10. Advanced emphysema. 11. Asymmetric interstitial prominence within the RIGHT upper lobe, suspicious for lymphangitis carcinomatosis. 12. Diffuse coronary artery calcifications, particularly dense within the LEFT anterior descending coronary artery.  Request for biopsy of peripheral lesion Dr Annamaria Boots has reviewed imaging and approves Rt scapular lesion bx Scheduled for Mon 11/25  Past Medical History:  Diagnosis Date  . Low magnesium level   . Tobacco abuse     Past Surgical History:  Procedure Laterality Date  . SURGERY SCROTAL / TESTICULAR      Allergies: Patient has no known allergies.  Medications: Prior to Admission medications   Medication Sig Start Date End Date Taking? Authorizing Provider  cyclobenzaprine (FLEXERIL) 10 MG tablet Take 10 mg by mouth every 12 (twelve) hours as needed for muscle spasms.   Yes [provider]  magnesium oxide (MAG-OX) 400 MG tablet Take 400 mg by mouth daily.   Yes [provider]  naproxen (NAPROSYN) 500 MG tablet Take 500 mg by mouth every 12 (twelve) hours as needed for mild pain.   Yes [provider]  predniSONE (DELTASONE) 10 MG tablet 3/3/2/2/1/1Single daily dose for 6 day Patient not taking: Reported on 06/16/2018 10/24/11   Leandrew Koyanagi, MD     Family History  Problem Relation Age of Onset  . Lymphoma Brother     Social History   Socioeconomic History  . Marital status: Married    Spouse  name: Not on file  . Number of children: Not on file  . Years of education: Not  on file  . Highest education level: Not on file  Occupational History  . Not on file  Social Needs  . Financial resource strain: Not on file  . Food insecurity:    Worry: Not on file    Inability: Not on file  . Transportation needs:    Medical: Not on file    Non-medical: Not on file  Tobacco Use  . Smoking status: Current Every Day Smoker    Packs/day: 2.00    Years: 30.00    Pack years: 60.00  . Smokeless tobacco: Never Used  Substance and Sexual Activity  . Alcohol use: No  . Drug use: No  . Sexual activity: Not on file  Lifestyle  . Physical activity:    Days per week: Not on file    Minutes per session: Not on file  . Stress: Not on file  Relationships  . Social connections:    Talks on phone: Not on file    Gets together: Not on file    Attends religious service: Not on file    Active member of club or organization: Not on file    Attends meetings of clubs or organizations: Not on file    Relationship status: Not on file  Other Topics Concern  . Not on file  Social History Narrative  . Not on file    Review of Systems: A 12 point ROS discussed and pertinent positives are indicated in the HPI above.  All other systems are negative.  Review of Systems  Constitutional: Positive for activity change. Negative for fatigue, fever and unexpected weight change.  Musculoskeletal: Positive for back pain and neck pain.  Neurological: Negative for weakness.  Psychiatric/Behavioral: Negative for behavioral problems and confusion.    Vital Signs: BP (!) 118/96 (BP Location: Right Arm)   Pulse 93   Temp 98.8 F (37.1 C) (Oral)   Resp 16   Ht 5\' 7"  (1.702 m)   Wt 132 lb 4.4 oz (60 kg)   SpO2 95%   BMI 20.72 kg/m   Physical Exam  Constitutional: He is oriented to person, place, and time.  Neck:  Painful to move In C collar  Cardiovascular: Normal rate, regular rhythm and normal heart sounds.  Pulmonary/Chest: Effort normal and breath sounds normal.    Abdominal: Soft. Bowel sounds are normal.  Musculoskeletal: Normal range of motion. He exhibits tenderness.  Neurological: He is alert and oriented to person, place, and time.  Skin: Skin is warm.  Psychiatric: He has a normal mood and affect. His behavior is normal. Judgment and thought content normal.  Vitals reviewed.   Imaging: Ct Chest W Contrast  Result Date: 06/16/2018 CLINICAL DATA:  Bones/soft tissue sarcoma, metastasis screening. Neck pain for months. EXAM: CT CHEST, ABDOMEN, AND PELVIS WITH CONTRAST TECHNIQUE: Multidetector CT imaging of the chest, abdomen and pelvis was performed following the standard protocol during bolus administration of intravenous contrast. CONTRAST:  167mL OMNIPAQUE IOHEXOL 300 MG/ML  SOLN COMPARISON:  None. FINDINGS: CT CHEST FINDINGS Cardiovascular: Heart size is normal. No pericardial effusion. No thoracic aortic aneurysm. Mild aortic atherosclerosis. Diffuse coronary artery calcifications, particularly dense within the LEFT anterior descending coronary artery. Mediastinum/Nodes: Enlarged and morphologically abnormal lymph nodes are seen within the mediastinum and perihilar regions, consistent with metastasis. This includes a low-density enlarged lymph node in the RIGHT hilum, measuring 2.1 cm short axis (series 3,  image 41), an enlarged lymph node in the subcarinal space measuring 2.1 cm short axis (series 3, image 39) and a morphologically abnormal low-density lymph node within the RIGHT lower paratracheal space measuring 1.3 cm short axis (series 3, image 28). Esophagus is unremarkable. Trachea appears normal. Lungs/Pleura: Advanced emphysematous changes bilaterally, upper lobe predominant. 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 Spiculated pulmonary nodule within the RIGHT lower lobe measuring 9 mm (series 5, image 95). Prominent parabronchial thickening involving the bronchial segments to the  RIGHT lower lobe, likely lymphangitic extension of the perihilar lymph node metastases. Associated narrowing of the RIGHT lower lobe and middle lobe bronchi, without occlusion. Musculoskeletal: Soft tissue mass involving the upper RIGHT scapula, extending into the lower neck, measuring 5.5 x 5.4 cm (series 3, image 8). Additional neoplastic masses involving the posterior RIGHT sixth and seventh ribs, measuring 2.9 cm and 3 cm respectively (series 3, image 29). Additional neoplastic mass involving the posterior RIGHT tenth rib, measuring 4 cm (series 3, image 48). CT ABDOMEN PELVIS FINDINGS Hepatobiliary: No focal liver abnormality is seen. No gallstones, gallbladder wall thickening, or biliary dilatation. Pancreas: Unremarkable. No pancreatic ductal dilatation or surrounding inflammatory changes. Spleen: Normal in size without focal abnormality. Adrenals/Urinary Tract: RIGHT adrenal mass measures 1.7 cm. LEFT adrenal gland is enlarged without discrete mass. Kidneys are unremarkable without mass, stone or hydronephrosis. Bladder appears normal. Stomach/Bowel: No dilated large or small bowel loops. No evidence of focal bowel wall thickening or bowel wall inflammation. Appendix is normal. Stomach is unremarkable. Vascular/Lymphatic: Aortic atherosclerosis. No acute appearing vascular abnormality. No enlarged lymph nodes appreciated. Mildly prominent lymph nodes within the periaortic retroperitoneum are suspicious for additional metastatic disease. Reproductive: Prostate is unremarkable. Other: No free fluid or abscess collection. No free intraperitoneal air. Musculoskeletal: Neoplastic soft tissue mass involving the RIGHT lateral half of the L4 vertebral body, with probable associated pathologic fractures, the soft tissue component measuring 4.7 x 4.7 cm (series 3, image 87). The mass involves the RIGHT pedicle. No convincing extension of the mass into the central canal. Mass does involve the RIGHT neural foramen with  probable associated nerve root impingement. IMPRESSION: 1. Soft tissue mass involving the upper RIGHT scapula, extending into the lower neck, measuring 5.5 x 5.4 cm, consistent with metastatic disease. 2. Additional destructive neoplastic masses involving the posterior RIGHT sixth and seventh ribs, measuring 2.9 cm and 3 cm respectively. 3. Additional destructive neoplastic mass involving the posterior RIGHT tenth rib, measuring 4 cm. 4. Enlarged and morphologically abnormal lymph nodes within the mediastinum and perihilar regions, consistent with metastatic disease. 5. Spiculated pulmonary nodule within the RIGHT lower lobe, measuring 9 mm, also suspicious for metastatic disease. 6. Prominent parabronchial thickening involving the bronchial segments to the RIGHT lower lobe, likely lymphangitic extension of the perihilar lymph node metastases. Associated narrowing of the RIGHT lower lobe and middle lobe bronchi, without occlusion. 7. Mildly prominent lymph nodes within the periaortic retroperitoneum, 1 of which lies posterior to the IVC and is highly suspicious for additional metastatic disease. 8. RIGHT adrenal mass measuring 1.7 cm, suspicious for adrenal metastasis. 9. Neoplastic soft tissue mass involving the RIGHT lateral half of the L4 vertebral body, with associated destruction of the vertebral body and with probable associated pathologic fractures, measuring approximately 4 cm greatest dimension. This mass involves the proximal portion of the RIGHT pedicle and extends to the RIGHT neural foramen with probable associated nerve root compression. 10. Advanced emphysema. 11. Asymmetric interstitial prominence within the RIGHT  upper lobe, suspicious for lymphangitis carcinomatosis. 12. Diffuse coronary artery calcifications, particularly dense within the LEFT anterior descending coronary artery. Aortic Atherosclerosis (ICD10-I70.0) and Emphysema (ICD10-J43.9). Electronically Signed   By: Franki Cabot M.D.   On:  06/16/2018 23:50   Ct Cervical Spine Wo Contrast  Result Date: 06/16/2018 CLINICAL DATA:  Neck pain months.  Right-sided numbness and tingling EXAM: CT CERVICAL SPINE WITHOUT CONTRAST TECHNIQUE: Multidetector CT imaging of the cervical spine was performed without intravenous contrast. Multiplanar CT image reconstructions were also generated. COMPARISON:  Cervical spine MRI 06/16/2018 FINDINGS: Alignment: There is grade 1 retrolisthesis at the C2-C3 level. Alignment is otherwise normal. Skull base and vertebrae: There is a destructive lesion of the lower aspect of the C2 vertebral body that extends into the right-sided posterior elements. On axial images, the soft tissue mass measures 3.3 cm transverse by 2.9 cm AP. There is retropulsion/epidural extension as demonstrated on the concomitant MRI. The mass extends into the right neural foramen there is also destruction of the right posterosuperior corner of C3. No lower cervical vertebral lesions. Soft tissues and spinal canal: There is soft tissue thickening anterior to C2. Disc levels: Disc space narrowing is greatest at C2-3 and C6-7. As above, there is retropulsion at C2. This effaces the ventral thecal sac and causes mild spinal canal stenosis. Upper chest: Biapical emphysema. Other: None IMPRESSION: 1. Soft tissue mass of the lower aspect of C2, with associated osseous destruction, most consistent with plasmacytoma or metastasis. 4 mm of retropulsion/posterior epidural extension with associated mild spinal canal stenosis, better characterized on concomitant MRI. The mass extends into the right neural foramen. 2. Mild destruction of the posterosuperior corner of C3. 3. Biapical emphysema.  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 Electronically Signed   By: Ulyses Jarred M.D.   On: 06/16/2018 23:46   Ct Abdomen Pelvis W Contrast  Result Date: 06/16/2018 CLINICAL DATA:  Bones/soft tissue sarcoma, metastasis screening. Neck pain for months. EXAM: CT CHEST, ABDOMEN, AND PELVIS WITH  CONTRAST TECHNIQUE: Multidetector CT imaging of the chest, abdomen and pelvis was performed following the standard protocol during bolus administration of intravenous contrast. CONTRAST:  133mL OMNIPAQUE IOHEXOL 300 MG/ML  SOLN COMPARISON:  None. FINDINGS: CT CHEST FINDINGS Cardiovascular: Heart size is normal. No pericardial effusion. No thoracic aortic aneurysm. Mild aortic atherosclerosis. Diffuse coronary artery calcifications, particularly dense within the LEFT anterior descending coronary artery. Mediastinum/Nodes: Enlarged and morphologically abnormal lymph nodes are seen within the mediastinum and perihilar regions, consistent with metastasis. This includes a low-density enlarged lymph node in the RIGHT hilum, measuring 2.1 cm short axis (series 3, image 41), an enlarged lymph node in the subcarinal space measuring 2.1 cm short axis (series 3, image 39) and a morphologically abnormal low-density lymph node within the RIGHT lower paratracheal space measuring 1.3 cm short axis (series 3, image 28). Esophagus is unremarkable. Trachea appears normal. Lungs/Pleura: Advanced emphysematous changes bilaterally, upper lobe predominant. 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 Spiculated pulmonary nodule within the RIGHT lower lobe measuring 9 mm (series 5, image 95). Prominent parabronchial thickening involving the bronchial segments to the RIGHT lower lobe, likely lymphangitic extension of the perihilar lymph node metastases. Associated narrowing of the RIGHT lower lobe and middle lobe bronchi, without occlusion. Musculoskeletal: Soft tissue mass involving the upper RIGHT scapula, extending into the lower neck, measuring 5.5 x 5.4 cm (series 3, image 8). Additional neoplastic masses involving the posterior RIGHT sixth and seventh ribs, measuring 2.9 cm and 3 cm respectively (series 3, image  29). Additional neoplastic mass involving the posterior RIGHT  tenth rib, measuring 4 cm (series 3, image 48). CT ABDOMEN PELVIS FINDINGS Hepatobiliary: No focal liver abnormality is seen. No gallstones, gallbladder wall thickening, or biliary dilatation. Pancreas: Unremarkable. No pancreatic ductal dilatation or surrounding inflammatory changes. Spleen: Normal in size without focal abnormality. Adrenals/Urinary Tract: RIGHT adrenal mass measures 1.7 cm. LEFT adrenal gland is enlarged without discrete mass. Kidneys are unremarkable without mass, stone or hydronephrosis. Bladder appears normal. Stomach/Bowel: No dilated large or small bowel loops. No evidence of focal bowel wall thickening or bowel wall inflammation. Appendix is normal. Stomach is unremarkable. Vascular/Lymphatic: Aortic atherosclerosis. No acute appearing vascular abnormality. No enlarged lymph nodes appreciated. Mildly prominent lymph nodes within the periaortic retroperitoneum are suspicious for additional metastatic disease. Reproductive: Prostate is unremarkable. Other: No free fluid or abscess collection. No free intraperitoneal air. Musculoskeletal: Neoplastic soft tissue mass involving the RIGHT lateral half of the L4 vertebral body, with probable associated pathologic fractures, the soft tissue component measuring 4.7 x 4.7 cm (series 3, image 87). The mass involves the RIGHT pedicle. No convincing extension of the mass into the central canal. Mass does involve the RIGHT neural foramen with probable associated nerve root impingement. IMPRESSION: 1. Soft tissue mass involving the upper RIGHT scapula, extending into the lower neck, measuring 5.5 x 5.4 cm, consistent with metastatic disease. 2. Additional destructive neoplastic masses involving the posterior RIGHT sixth and seventh ribs, measuring 2.9 cm and 3 cm respectively. 3. Additional destructive neoplastic mass involving the posterior RIGHT tenth rib, measuring 4 cm. 4. Enlarged and morphologically abnormal lymph nodes within the mediastinum and  perihilar regions, consistent with metastatic disease. 5. Spiculated pulmonary nodule within the RIGHT lower lobe, measuring 9 mm, also suspicious for metastatic disease. 6. Prominent parabronchial thickening involving the bronchial segments to the RIGHT lower lobe, likely lymphangitic extension of the perihilar lymph node metastases. Associated narrowing of the RIGHT lower lobe and middle lobe bronchi, without occlusion. 7. Mildly prominent lymph nodes within the periaortic retroperitoneum, 1 of which lies posterior to the IVC and is highly suspicious for additional metastatic disease. 8. RIGHT adrenal mass measuring 1.7 cm, suspicious for adrenal metastasis. 9. Neoplastic soft tissue mass involving the RIGHT lateral half of the L4 vertebral body, with associated destruction of the vertebral body and with probable associated pathologic fractures, measuring approximately 4 cm greatest dimension. This mass involves the proximal portion of the RIGHT pedicle and extends to the RIGHT neural foramen with probable associated nerve root compression. 10. Advanced emphysema. 11. Asymmetric interstitial prominence within the RIGHT upper lobe, suspicious for lymphangitis carcinomatosis. 12. Diffuse coronary artery calcifications, particularly dense within the LEFT anterior descending coronary artery. Aortic Atherosclerosis (ICD10-I70.0) and Emphysema (ICD10-J43.9). Electronically Signed   By: Franki Cabot M.D.   On: 06/16/2018 23:50    Labs:  CBC: Recent Labs    06/16/18 2049 06/16/18 2100 06/17/18 0313  WBC 9.7  --  9.9  HGB 14.1 13.9 14.5  HCT 42.6 41.0 42.4  PLT 285  --  284    COAGS: No results for input(s): INR, APTT in the last 8760 hours.  BMP: Recent Labs    06/16/18 2049 06/16/18 2100 06/17/18 0313  NA 134* 139 136  K 3.1* 3.2* 3.1*  CL 101 101 101  CO2 26  --  26  GLUCOSE 96 90 93  BUN 18 21* 15  CALCIUM 9.5  --  9.5  CREATININE 0.95 0.80 0.95  GFRNONAA >60  --  >  60  GFRAA >60  --   >60    LIVER FUNCTION TESTS: Recent Labs    06/17/18 0710  BILITOT 0.8  AST 16  ALT 11  ALKPHOS 93  PROT 6.9  ALBUMIN 3.8    TUMOR MARKERS: No results for input(s): AFPTM, CEA, CA199, CHROMGRNA in the last 8760 hours.  Assessment and Plan:  Diffuse bony lesions on CT Scheduled for Rt scapular lesion biopsy Mon in IR Risks and benefits discussed with the patient including, but not limited to bleeding, infection, damage to adjacent structures or low yield requiring additional tests.  All of the patient's questions were answered, patient is agreeable to proceed. Consent signed and in chart.   Thank you for this interesting consult.  I greatly enjoyed meeting Timothy Wade and look forward to participating in their care.  A copy of this report was sent to the requesting provider on this date.  Electronically Signed: Lavonia Drafts, PA-C 06/17/2018, 1:35 PM   I spent a total of 40 Minutes    in face to face in clinical consultation, greater than 50% of which was counseling/coordinating care for right scapular lesion bx

## 2018-06-17 NOTE — Progress Notes (Signed)
Patient admitted after midnight, please see H&P.  Here with neck pain and numbness and tingling.  He was found to have a destructive lesion on C2 and C3 that appears to be neoplastic disease.  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  He does believe he has lost weight but is not sure how much. On right upper scapula has palpable lesion that is tender to touch. Plan is for Rt scapular lesion biopsy Mon in [place]

## 2018-06-18 DIAGNOSIS — C799 Secondary malignant neoplasm of unspecified site: Secondary | ICD-10-CM | POA: Diagnosis present

## 2018-06-18 DIAGNOSIS — R221 Localized swelling, mass and lump, neck: Secondary | ICD-10-CM | POA: Diagnosis not present

## 2018-06-18 DIAGNOSIS — R0902 Hypoxemia: Secondary | ICD-10-CM | POA: Diagnosis not present

## 2018-06-18 DIAGNOSIS — F17210 Nicotine dependence, cigarettes, uncomplicated: Secondary | ICD-10-CM | POA: Diagnosis present

## 2018-06-18 DIAGNOSIS — I361 Nonrheumatic tricuspid (valve) insufficiency: Secondary | ICD-10-CM | POA: Diagnosis not present

## 2018-06-18 DIAGNOSIS — Z79899 Other long term (current) drug therapy: Secondary | ICD-10-CM | POA: Diagnosis not present

## 2018-06-18 DIAGNOSIS — M542 Cervicalgia: Secondary | ICD-10-CM | POA: Diagnosis present

## 2018-06-18 DIAGNOSIS — C3490 Malignant neoplasm of unspecified part of unspecified bronchus or lung: Secondary | ICD-10-CM | POA: Diagnosis present

## 2018-06-18 DIAGNOSIS — Z807 Family history of other malignant neoplasms of lymphoid, hematopoietic and related tissues: Secondary | ICD-10-CM | POA: Diagnosis not present

## 2018-06-18 DIAGNOSIS — J9601 Acute respiratory failure with hypoxia: Secondary | ICD-10-CM | POA: Diagnosis not present

## 2018-06-18 DIAGNOSIS — C7951 Secondary malignant neoplasm of bone: Secondary | ICD-10-CM | POA: Diagnosis present

## 2018-06-18 DIAGNOSIS — J439 Emphysema, unspecified: Secondary | ICD-10-CM | POA: Diagnosis present

## 2018-06-18 DIAGNOSIS — J811 Chronic pulmonary edema: Secondary | ICD-10-CM | POA: Diagnosis present

## 2018-06-18 LAB — CBC
HCT: 41.6 % (ref 39.0–52.0)
Hemoglobin: 14.3 g/dL (ref 13.0–17.0)
MCH: 31.5 pg (ref 26.0–34.0)
MCHC: 34.4 g/dL (ref 30.0–36.0)
MCV: 91.6 fL (ref 80.0–100.0)
NRBC: 0 % (ref 0.0–0.2)
Platelets: 288 10*3/uL (ref 150–400)
RBC: 4.54 MIL/uL (ref 4.22–5.81)
RDW: 12.3 % (ref 11.5–15.5)
WBC: 9.5 10*3/uL (ref 4.0–10.5)

## 2018-06-18 LAB — BASIC METABOLIC PANEL
ANION GAP: 8 (ref 5–15)
BUN: 13 mg/dL (ref 6–20)
CO2: 25 mmol/L (ref 22–32)
CREATININE: 0.76 mg/dL (ref 0.61–1.24)
Calcium: 9.5 mg/dL (ref 8.9–10.3)
Chloride: 102 mmol/L (ref 98–111)
GFR calc Af Amer: >60 mL/min (ref >60)
GFR calc non Af Amer: >60 mL/min (ref >60)
GLUCOSE: 95 mg/dL (ref 70–99)
POTASSIUM: 3.5 mmol/L (ref 3.5–5.1)
SODIUM: 135 mmol/L (ref 135–145)

## 2018-06-18 MED ORDER — HYDROMORPHONE HCL 1 MG/ML IJ SOLN
1.0000 mg | INTRAMUSCULAR | Status: DC | PRN
  Administered 2018-06-18 – 2018-06-19 (×4): 1 mg via INTRAVENOUS
  Administered 2018-06-19 – 2018-06-20 (×2): 2 mg via INTRAVENOUS
  Filled 2018-06-18 (×3): qty 1
  Filled 2018-06-18 (×2): qty 2
  Filled 2018-06-18: qty 1

## 2018-06-18 MED ORDER — BISACODYL 10 MG RE SUPP: 10.0000 mg | Freq: Every day | RECTAL | Status: DC | PRN

## 2018-06-18 MED ORDER — MORPHINE SULFATE (PF) 2 MG/ML IV SOLN
2.0000 mg | INTRAVENOUS | Status: DC | PRN
  Administered 2018-06-18: 2 mg via INTRAVENOUS
  Filled 2018-06-18: qty 1

## 2018-06-18 MED ORDER — SENNA 8.6 MG PO TABS
1.0000 | ORAL_TABLET | Freq: Every day | ORAL | Status: DC
  Administered 2018-06-18 – 2018-06-20 (×3): 8.6 mg via ORAL
  Filled 2018-06-18 (×3): qty 1

## 2018-06-18 MED ORDER — HYDROCODONE-ACETAMINOPHEN 5-325 MG PO TABS
1.0000 | ORAL_TABLET | Freq: Four times a day (QID) | ORAL | Status: DC | PRN
  Administered 2018-06-18 – 2018-06-19 (×3): 2 via ORAL
  Filled 2018-06-18 (×3): qty 2

## 2018-06-18 MED ORDER — POLYETHYLENE GLYCOL 3350 17 G PO PACK
17.0000 g | PACK | Freq: Every day | ORAL | Status: DC | PRN
  Administered 2018-06-19: 17 g via ORAL
  Filled 2018-06-18: qty 1

## 2018-06-18 NOTE — Progress Notes (Signed)
No significant change in status.  Patient's neck pain well controlled in collar.  Biopsy of scapular region planned for tomorrow.  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  Patient will need palliative radiation to his upper cervical spine.  Following radiation the patient will need to remain immobilized in a cervical collar and we will assess for evidence of progressive instability and need for surgical intervention as time goes on.  No current indication for urgent surgical intervention.  Patient will also likely need palliative radiation to his lumbar spinal lesion.

## 2018-06-18 NOTE — Progress Notes (Signed)
Received pt per wheelchair. Pt alert and oriented. Pt with neck collar. Pt ambulated to the bed with standby assist. Pt complains of mild pain to right shoulder. Denies short of breath. Oriented pt to room. Will monitor pt.

## 2018-06-18 NOTE — Progress Notes (Signed)
Progress Note    Timothy Wade  XTK:[id number] DOB: 08-03-1960  DOA: 06/16/2018 PCP: Barnie Mort, NP    Brief Narrative:    Medical records reviewed and are as summarized below:  Timothy Wade is an 57 y.o. male with history of tobacco abuse has been experiencing neck pain for the last 1 month.  Had MRI of the C-spine done as outpatient which showed destructive lesion of the C2-C3 concerning for malignancy or metastatic disease.  Patient was referred to the ER.  Denies any chest pain shortness of breath or any weakness of upper or lower extremities or any incontinence of urine or bowel.  Assessment/Plan:   Active Problems:   Neck pain   Neck mass   Metastatic cancer (HCC)   Probable metastatic cancer: 1. Soft tissue mass involving the upper RIGHT scapula, extending into the lower neck, measuring 5.5 x 5.4 cm, consistent with metastatic disease. 2. Additional destructive neoplastic masses involving the posterior RIGHT sixth and seventh ribs, measuring 2.9 cm and 3 cm respectively. 3. Additional destructive neoplastic mass involving the posterior RIGHT tenth rib, measuring 4 cm. 4. Enlarged and morphologically abnormal lymph nodes within the mediastinum and perihilar regions, consistent with metastatic disease. 5. Spiculated pulmonary nodule within the RIGHT lower lobe, measuring 9 mm, also suspicious for metastatic disease. 6. Prominent parabronchial thickening involving the bronchial segments to the RIGHT lower lobe, likely lymphangitic extension of the perihilar lymph node metastases. Associated narrowing of the RIGHT lower lobe and middle lobe bronchi, without occlusion. 7. Mildly prominent lymph nodes within the periaortic retroperitoneum, 1 of which lies posterior to the IVC and is highly suspicious for additional metastatic disease. 8. RIGHT adrenal mass measuring 1.7 cm, suspicious for adrenal metastasis. 9. Neoplastic soft tissue mass involving the RIGHT  lateral half of the L4 vertebral body, with associated destruction of the vertebral body and with probable associated pathologic fractures, measuring approximately 4 cm greatest dimension. This mass involves the proximal portion of the RIGHT pedicle and extends to the RIGHT neural foramen with probable associated nerve root compression. 10. Advanced emphysema. 11. Asymmetric interstitial prominence within the RIGHT upper lobe, suspicious for lymphangitis carcinomatosis. 12. Diffuse coronary artery calcifications, particularly dense within the LEFT anterior descending coronary artery. - pain control -bowel regimen - plan in the AM for biopsy -once path back then can get oncology/radiation  Numbness and tingling due to C2 and C3 metastatic lesion -continue cervical collar -neurosurgery consult -most likely will need radiation  Tobacco abuse -encouraged cessation   Family Communication/Anticipated D/C date and plan/Code Status   DVT prophylaxis:scd Code Status: Full Code.  Family Communication:  Disposition Plan:    Medical Consultants:    IR  Subjective:   No BM since admission   Objective:    Vitals:   06/17/18 1227 06/17/18 1752 06/17/18 1955 06/18/18 0545  BP: (!) 118/96 132/84 119/89 120/82  Pulse: 93 95 91 89  Resp: 16 16 18 18   Temp: 98.8 F (37.1 C) 98.5 F (36.9 C) 98 F (36.7 C) 98 F (36.7 C)  TempSrc: Oral Oral Oral Oral  SpO2: 95% 100% 96% 95%  Weight:   58.9 kg   Height:   5\' 7"  (1.702 m)    No intake or output data in the 24 hours ending 06/18/18 1211 Filed Weights   06/16/18 2353 06/17/18 1955  Weight: 60 kg 58.9 kg    Exam: Alert, uncomfortable appearing rrr Clear On back around scapula are firm nodules that are tender  R>L  Data Reviewed:   I have personally reviewed following labs and imaging studies:  Labs: Labs show the following:   Basic Metabolic Panel: Recent Labs  Lab 06/16/18 2049 06/16/18 2100 06/17/18 0313  06/18/18 0235  NA 134* 139 136 135  K 3.1* 3.2* 3.1* 3.5  CL 101 101 101 102  CO2 26  --  26 25  GLUCOSE 96 90 93 95  BUN 18 21* 15 13  CREATININE 0.95 0.80 0.95 0.76  CALCIUM 9.5  --  9.5 9.5   GFR Estimated Creatinine Clearance: 85.9 mL/min (by C-G formula based on SCr of 0.76 mg/dL). Liver Function Tests: Recent Labs  Lab 06/17/18 0710  AST 16  ALT 11  ALKPHOS 93  BILITOT 0.8  PROT 6.9  ALBUMIN 3.8   No results for input(s): LIPASE, AMYLASE in the last 168 hours. No results for input(s): AMMONIA in the last 168 hours. Coagulation profile No results for input(s): INR, PROTIME in the last 168 hours.  CBC: Recent Labs  Lab 06/16/18 2049 06/16/18 2100 06/17/18 0313 06/18/18 0235  WBC 9.7  --  9.9 9.5  NEUTROABS 6.1  --   --   --   HGB 14.1 13.9 14.5 14.3  HCT 42.6 41.0 42.4 41.6  MCV 94.9  --  92.8 91.6  PLT 285  --  284 288   Cardiac Enzymes: No results for input(s): CKTOTAL, CKMB, CKMBINDEX, TROPONINI in the last 168 hours. BNP (last 3 results) No results for input(s): PROBNP in the last 8760 hours. CBG: No results for input(s): GLUCAP in the last 168 hours. D-Dimer: No results for input(s): DDIMER in the last 72 hours. Hgb A1c: No results for input(s): HGBA1C in the last 72 hours. Lipid Profile: No results for input(s): CHOL, HDL, LDLCALC, TRIG, CHOLHDL, LDLDIRECT in the last 72 hours. Thyroid function studies: No results for input(s): TSH, T4TOTAL, T3FREE, THYROIDAB in the last 72 hours.  Invalid input(s): FREET3 Anemia work up: No results for input(s): VITAMINB12, FOLATE, FERRITIN, TIBC, IRON, RETICCTPCT in the last 72 hours. Sepsis Labs: Recent Labs  Lab 06/16/18 2049 06/17/18 0313 06/18/18 0235  WBC 9.7 9.9 9.5    Microbiology No results found for this or any previous visit (from the past 240 hour(s)).  Procedures and diagnostic studies:  Ct Chest W Contrast  Result Date: 06/16/2018 CLINICAL DATA:  Bones/soft tissue sarcoma,  metastasis screening. Neck pain for months. EXAM: CT CHEST, ABDOMEN, AND PELVIS WITH CONTRAST TECHNIQUE: Multidetector CT imaging of the chest, abdomen and pelvis was performed following the standard protocol during bolus administration of intravenous contrast. CONTRAST:  164mL OMNIPAQUE IOHEXOL 300 MG/ML  SOLN COMPARISON:  None. FINDINGS: CT CHEST FINDINGS Cardiovascular: Heart size is normal. No pericardial effusion. No thoracic aortic aneurysm. Mild aortic atherosclerosis. Diffuse coronary artery calcifications, particularly dense within the LEFT anterior descending coronary artery. Mediastinum/Nodes: Enlarged and morphologically abnormal lymph nodes are seen within the mediastinum and perihilar regions, consistent with metastasis. This includes a low-density enlarged lymph node in the RIGHT hilum, measuring 2.1 cm short axis (series 3, image 41), an enlarged lymph node in the subcarinal space measuring 2.1 cm short axis (series 3, image 39) and a morphologically abnormal low-density lymph node within the RIGHT lower paratracheal space measuring 1.3 cm short axis (series 3, image 28). Esophagus is unremarkable. Trachea appears normal. Lungs/Pleura: Advanced emphysematous changes bilaterally, upper lobe predominant. 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 Spiculated pulmonary nodule within the RIGHT lower lobe measuring  9 mm (series 5, image 95). Prominent parabronchial thickening involving the bronchial segments to the RIGHT lower lobe, likely lymphangitic extension of the perihilar lymph node metastases. Associated narrowing of the RIGHT lower lobe and middle lobe bronchi, without occlusion. Musculoskeletal: Soft tissue mass involving the upper RIGHT scapula, extending into the lower neck, measuring 5.5 x 5.4 cm (series 3, image 8). Additional neoplastic masses involving the posterior RIGHT sixth and seventh ribs, measuring 2.9 cm and 3 cm  respectively (series 3, image 29). Additional neoplastic mass involving the posterior RIGHT tenth rib, measuring 4 cm (series 3, image 48). CT ABDOMEN PELVIS FINDINGS Hepatobiliary: No focal liver abnormality is seen. No gallstones, gallbladder wall thickening, or biliary dilatation. Pancreas: Unremarkable. No pancreatic ductal dilatation or surrounding inflammatory changes. Spleen: Normal in size without focal abnormality. Adrenals/Urinary Tract: RIGHT adrenal mass measures 1.7 cm. LEFT adrenal gland is enlarged without discrete mass. Kidneys are unremarkable without mass, stone or hydronephrosis. Bladder appears normal. Stomach/Bowel: No dilated large or small bowel loops. No evidence of focal bowel wall thickening or bowel wall inflammation. Appendix is normal. Stomach is unremarkable. Vascular/Lymphatic: Aortic atherosclerosis. No acute appearing vascular abnormality. No enlarged lymph nodes appreciated. Mildly prominent lymph nodes within the periaortic retroperitoneum are suspicious for additional metastatic disease. Reproductive: Prostate is unremarkable. Other: No free fluid or abscess collection. No free intraperitoneal air. Musculoskeletal: Neoplastic soft tissue mass involving the RIGHT lateral half of the L4 vertebral body, with probable associated pathologic fractures, the soft tissue component measuring 4.7 x 4.7 cm (series 3, image 87). The mass involves the RIGHT pedicle. No convincing extension of the mass into the central canal. Mass does involve the RIGHT neural foramen with probable associated nerve root impingement. IMPRESSION: 1. Soft tissue mass involving the upper RIGHT scapula, extending into the lower neck, measuring 5.5 x 5.4 cm, consistent with metastatic disease. 2. Additional destructive neoplastic masses involving the posterior RIGHT sixth and seventh ribs, measuring 2.9 cm and 3 cm respectively. 3. Additional destructive neoplastic mass involving the posterior RIGHT tenth rib,  measuring 4 cm. 4. Enlarged and morphologically abnormal lymph nodes within the mediastinum and perihilar regions, consistent with metastatic disease. 5. Spiculated pulmonary nodule within the RIGHT lower lobe, measuring 9 mm, also suspicious for metastatic disease. 6. Prominent parabronchial thickening involving the bronchial segments to the RIGHT lower lobe, likely lymphangitic extension of the perihilar lymph node metastases. Associated narrowing of the RIGHT lower lobe and middle lobe bronchi, without occlusion. 7. Mildly prominent lymph nodes within the periaortic retroperitoneum, 1 of which lies posterior to the IVC and is highly suspicious for additional metastatic disease. 8. RIGHT adrenal mass measuring 1.7 cm, suspicious for adrenal metastasis. 9. Neoplastic soft tissue mass involving the RIGHT lateral half of the L4 vertebral body, with associated destruction of the vertebral body and with probable associated pathologic fractures, measuring approximately 4 cm greatest dimension. This mass involves the proximal portion of the RIGHT pedicle and extends to the RIGHT neural foramen with probable associated nerve root compression. 10. Advanced emphysema. 11. Asymmetric interstitial prominence within the RIGHT upper lobe, suspicious for lymphangitis carcinomatosis. 12. Diffuse coronary artery calcifications, particularly dense within the LEFT anterior descending coronary artery. Aortic Atherosclerosis (ICD10-I70.0) and Emphysema (ICD10-J43.9). Electronically Signed   By: Franki Cabot M.D.   On: 06/16/2018 23:50   Ct Cervical Spine Wo Contrast  Result Date: 06/16/2018 CLINICAL DATA:  Neck pain months.  Right-sided numbness and tingling EXAM: CT CERVICAL SPINE WITHOUT CONTRAST TECHNIQUE: Multidetector CT imaging of the cervical  spine was performed without intravenous contrast. Multiplanar CT image reconstructions were also generated. COMPARISON:  Cervical spine MRI 06/16/2018 FINDINGS: Alignment: There is  grade 1 retrolisthesis at the C2-C3 level. Alignment is otherwise normal. Skull base and vertebrae: There is a destructive lesion of the lower aspect of the C2 vertebral body that extends into the right-sided posterior elements. On axial images, the soft tissue mass measures 3.3 cm transverse by 2.9 cm AP. There is retropulsion/epidural extension as demonstrated on the concomitant MRI. The mass extends into the right neural foramen there is also destruction of the right posterosuperior corner of C3. No lower cervical vertebral lesions. Soft tissues and spinal canal: There is soft tissue thickening anterior to C2. Disc levels: Disc space narrowing is greatest at C2-3 and C6-7. As above, there is retropulsion at C2. This effaces the ventral thecal sac and causes mild spinal canal stenosis. Upper chest: Biapical emphysema. Other: None IMPRESSION: 1. Soft tissue mass of the lower aspect of C2, with associated osseous destruction, most consistent with plasmacytoma or metastasis. 4 mm of retropulsion/posterior epidural extension with associated mild spinal canal stenosis, better characterized on concomitant MRI. The mass extends into the right neural foramen. 2. Mild destruction of the posterosuperior corner of C3. 3. Biapical emphysema.  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 Electronically Signed   By: Ulyses Jarred M.D.   On: 06/16/2018 23:46   Ct Abdomen Pelvis W Contrast  Result Date: 06/16/2018 CLINICAL DATA:  Bones/soft tissue sarcoma, metastasis screening. Neck pain for months. EXAM: CT CHEST, ABDOMEN, AND PELVIS WITH CONTRAST TECHNIQUE: Multidetector CT imaging of the chest, abdomen and pelvis was performed following the standard protocol during bolus administration of intravenous contrast. CONTRAST:  152mL OMNIPAQUE IOHEXOL 300 MG/ML  SOLN COMPARISON:  None. FINDINGS: CT CHEST FINDINGS Cardiovascular: Heart size is normal. No pericardial effusion. No thoracic aortic aneurysm. Mild aortic atherosclerosis. Diffuse coronary artery  calcifications, particularly dense within the LEFT anterior descending coronary artery. Mediastinum/Nodes: Enlarged and morphologically abnormal lymph nodes are seen within the mediastinum and perihilar regions, consistent with metastasis. This includes a low-density enlarged lymph node in the RIGHT hilum, measuring 2.1 cm short axis (series 3, image 41), an enlarged lymph node in the subcarinal space measuring 2.1 cm short axis (series 3, image 39) and a morphologically abnormal low-density lymph node within the RIGHT lower paratracheal space measuring 1.3 cm short axis (series 3, image 28). Esophagus is unremarkable. Trachea appears normal. Lungs/Pleura: Advanced emphysematous changes bilaterally, upper lobe predominant. 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 Spiculated pulmonary nodule within the RIGHT lower lobe measuring 9 mm (series 5, image 95). Prominent parabronchial thickening involving the bronchial segments to the RIGHT lower lobe, likely lymphangitic extension of the perihilar lymph node metastases. Associated narrowing of the RIGHT lower lobe and middle lobe bronchi, without occlusion. Musculoskeletal: Soft tissue mass involving the upper RIGHT scapula, extending into the lower neck, measuring 5.5 x 5.4 cm (series 3, image 8). Additional neoplastic masses involving the posterior RIGHT sixth and seventh ribs, measuring 2.9 cm and 3 cm respectively (series 3, image 29). Additional neoplastic mass involving the posterior RIGHT tenth rib, measuring 4 cm (series 3, image 48). CT ABDOMEN PELVIS FINDINGS Hepatobiliary: No focal liver abnormality is seen. No gallstones, gallbladder wall thickening, or biliary dilatation. Pancreas: Unremarkable. No pancreatic ductal dilatation or surrounding inflammatory changes. Spleen: Normal in size without focal abnormality. Adrenals/Urinary Tract: RIGHT adrenal mass measures 1.7 cm. LEFT adrenal gland is  enlarged without discrete mass. Kidneys are unremarkable without mass,  stone or hydronephrosis. Bladder appears normal. Stomach/Bowel: No dilated large or small bowel loops. No evidence of focal bowel wall thickening or bowel wall inflammation. Appendix is normal. Stomach is unremarkable. Vascular/Lymphatic: Aortic atherosclerosis. No acute appearing vascular abnormality. No enlarged lymph nodes appreciated. Mildly prominent lymph nodes within the periaortic retroperitoneum are suspicious for additional metastatic disease. Reproductive: Prostate is unremarkable. Other: No free fluid or abscess collection. No free intraperitoneal air. Musculoskeletal: Neoplastic soft tissue mass involving the RIGHT lateral half of the L4 vertebral body, with probable associated pathologic fractures, the soft tissue component measuring 4.7 x 4.7 cm (series 3, image 87). The mass involves the RIGHT pedicle. No convincing extension of the mass into the central canal. Mass does involve the RIGHT neural foramen with probable associated nerve root impingement. IMPRESSION: 1. Soft tissue mass involving the upper RIGHT scapula, extending into the lower neck, measuring 5.5 x 5.4 cm, consistent with metastatic disease. 2. Additional destructive neoplastic masses involving the posterior RIGHT sixth and seventh ribs, measuring 2.9 cm and 3 cm respectively. 3. Additional destructive neoplastic mass involving the posterior RIGHT tenth rib, measuring 4 cm. 4. Enlarged and morphologically abnormal lymph nodes within the mediastinum and perihilar regions, consistent with metastatic disease. 5. Spiculated pulmonary nodule within the RIGHT lower lobe, measuring 9 mm, also suspicious for metastatic disease. 6. Prominent parabronchial thickening involving the bronchial segments to the RIGHT lower lobe, likely lymphangitic extension of the perihilar lymph node metastases. Associated narrowing of the RIGHT lower lobe and middle lobe bronchi, without  occlusion. 7. Mildly prominent lymph nodes within the periaortic retroperitoneum, 1 of which lies posterior to the IVC and is highly suspicious for additional metastatic disease. 8. RIGHT adrenal mass measuring 1.7 cm, suspicious for adrenal metastasis. 9. Neoplastic soft tissue mass involving the RIGHT lateral half of the L4 vertebral body, with associated destruction of the vertebral body and with probable associated pathologic fractures, measuring approximately 4 cm greatest dimension. This mass involves the proximal portion of the RIGHT pedicle and extends to the RIGHT neural foramen with probable associated nerve root compression. 10. Advanced emphysema. 11. Asymmetric interstitial prominence within the RIGHT upper lobe, suspicious for lymphangitis carcinomatosis. 12. Diffuse coronary artery calcifications, particularly dense within the LEFT anterior descending coronary artery. Aortic Atherosclerosis (ICD10-I70.0) and Emphysema (ICD10-J43.9). Electronically Signed   By: Franki Cabot M.D.   On: 06/16/2018 23:50    Medications:   . magnesium oxide  400 mg Oral Daily  . senna  1 tablet Oral Daily   Continuous Infusions:   LOS: 0 days   Geradine Girt  [facility]   *Please refer to [email or web address], password [email or web address] to get updated schedule on who will round on this patient, as hospitalists switch teams weekly. If 7PM-7AM, please contact night-coverage at [email or web address], password [email or web address] for any overnight needs.  06/18/2018, 12:11 PM

## 2018-06-19 ENCOUNTER — Encounter: Payer: Self-pay | Admitting: Oncology

## 2018-06-19 ENCOUNTER — Inpatient Hospital Stay (HOSPITAL_COMMUNITY): Payer: Managed Care, Other (non HMO)

## 2018-06-19 ENCOUNTER — Ambulatory Visit
Admit: 2018-06-19 | Discharge: 2018-06-19 | Disposition: A | Discharge status: Home / Self Care | Payer: Managed Care, Other (non HMO) | Attending: Radiation Oncology | Admitting: Radiation Oncology

## 2018-06-19 ENCOUNTER — Ambulatory Visit
Admission: RE | Admit: 2018-06-19 | Discharge: 2018-06-19 | Disposition: A | Discharge status: Home / Self Care | Payer: Managed Care, Other (non HMO) | Source: Ambulatory Visit | Attending: Radiation Oncology | Admitting: Radiation Oncology

## 2018-06-19 VITALS — BP 112/83 | HR 96 | Temp 97.6°F | Resp 20

## 2018-06-19 DIAGNOSIS — C799 Secondary malignant neoplasm of unspecified site: Secondary | ICD-10-CM

## 2018-06-19 DIAGNOSIS — Z510 Encounter for antineoplastic radiation therapy: Secondary | ICD-10-CM | POA: Insufficient documentation

## 2018-06-19 DIAGNOSIS — R0902 Hypoxemia: Secondary | ICD-10-CM

## 2018-06-19 DIAGNOSIS — C801 Malignant (primary) neoplasm, unspecified: Secondary | ICD-10-CM | POA: Insufficient documentation

## 2018-06-19 DIAGNOSIS — C7951 Secondary malignant neoplasm of bone: Secondary | ICD-10-CM | POA: Insufficient documentation

## 2018-06-19 LAB — PROTEIN ELECTROPHORESIS, SERUM
A/G Ratio: 1.3 (ref 0.7–1.7)
ALPHA-1-GLOBULIN: 0.3 g/dL (ref 0.0–0.4)
ALPHA-2-GLOBULIN: 0.8 g/dL (ref 0.4–1.0)
Albumin ELP: 3.6 g/dL (ref 2.9–4.4)
Beta Globulin: 0.9 g/dL (ref 0.7–1.3)
GLOBULIN, TOTAL: 2.7 g/dL (ref 2.2–3.9)
Gamma Globulin: 0.7 g/dL (ref 0.4–1.8)
Total Protein ELP: 6.3 g/dL (ref 6.0–8.5)

## 2018-06-19 LAB — BRAIN NATRIURETIC PEPTIDE: B NATRIURETIC PEPTIDE 5: 15.8 pg/mL (ref 0.0–100.0)

## 2018-06-19 LAB — IMMUNOFIXATION, URINE

## 2018-06-19 LAB — PROTIME-INR
INR: 1.01
PROTHROMBIN TIME: 13.2 s (ref 11.4–15.2)

## 2018-06-19 MED ORDER — MIDAZOLAM HCL 2 MG/2ML IJ SOLN
INTRAMUSCULAR | Status: AC | PRN
  Administered 2018-06-19: 0.5 mg via INTRAVENOUS
  Administered 2018-06-19: 1 mg via INTRAVENOUS

## 2018-06-19 MED ORDER — LIDOCAINE HCL 1 % IJ SOLN
INTRAMUSCULAR | Status: AC
  Filled 2018-06-19: qty 20

## 2018-06-19 MED ORDER — HYDROCODONE-ACETAMINOPHEN 5-325 MG PO TABS
1.0000 | ORAL_TABLET | ORAL | Status: DC | PRN
  Administered 2018-06-19 – 2018-06-20 (×5): 2 via ORAL
  Filled 2018-06-19 (×5): qty 2

## 2018-06-19 MED ORDER — IPRATROPIUM-ALBUTEROL 0.5-2.5 (3) MG/3ML IN SOLN: 3.0000 mL | RESPIRATORY_TRACT | Status: DC | PRN

## 2018-06-19 MED ORDER — FUROSEMIDE 10 MG/ML IJ SOLN
40.0000 mg | Freq: Every day | INTRAMUSCULAR | Status: DC
  Administered 2018-06-19: 40 mg via INTRAVENOUS
  Filled 2018-06-19: qty 4

## 2018-06-19 MED ORDER — FENTANYL CITRATE (PF) 100 MCG/2ML IJ SOLN
INTRAMUSCULAR | Status: AC
  Filled 2018-06-19: qty 4

## 2018-06-19 MED ORDER — HYDROCODONE-ACETAMINOPHEN 5-325 MG PO TABS: 1.0000 | ORAL_TABLET | ORAL | Status: DC | PRN

## 2018-06-19 MED ORDER — MIDAZOLAM HCL 2 MG/2ML IJ SOLN
INTRAMUSCULAR | Status: AC
  Filled 2018-06-19: qty 4

## 2018-06-19 MED ORDER — OXYCODONE HCL ER 10 MG PO T12A
10.0000 mg | EXTENDED_RELEASE_TABLET | Freq: Two times a day (BID) | ORAL | Status: DC
  Administered 2018-06-19 – 2018-06-20 (×3): 10 mg via ORAL
  Filled 2018-06-19 (×3): qty 1

## 2018-06-19 MED ORDER — FENTANYL CITRATE (PF) 100 MCG/2ML IJ SOLN
INTRAMUSCULAR | Status: AC | PRN
  Administered 2018-06-19: 50 ug via INTRAVENOUS

## 2018-06-19 NOTE — Progress Notes (Signed)
Radiation Oncology         ([phone]) [phone] ________________________________  Initial inpatient Consultation  Name: Timothy Wade MRN: [id number]  Date of Service: 06/19/2018 DOB: 1961/07/07  ID:POEU, [place], NP  Barnie Mort, NP   REFERRING PHYSICIAN: Barnie Mort, NP  DIAGNOSIS: 57 yo man with a C2 metastasis from widely metastatic cancer pending biopsy    ICD-10-CM   1. Metastatic cancer (HCC) C79.9     HISTORY OF PRESENT ILLNESS: Timothy Wade is a 57 y.o. male seen at the request of Dr. Annette Stable of neurosurgery. He presented to his PCP on 06/16/18 with neck pain lasting approximately 5 weeks and new onset tingling in his lateral right leg for 2 days. He had a C-spine MRI done as outpatient, which showed a destructive lesion of the C2-C3 concerning for malignancy or metastatic disease. He was then referred to the ER. He underwent metastatic work up with CT scans of the chest, abdomen/pelvis, and C-spine, which showed extensive metastatic disease, with the largest mass involving the upper right scapula. He proceeded to biopsy of the scapula mass this morning, 06/19/18, with pathology results pending.      PREVIOUS RADIATION THERAPY: No  PAST MEDICAL HISTORY:  Past Medical History:  Diagnosis Date  . Low magnesium level   . Tobacco abuse       PAST SURGICAL HISTORY: Past Surgical History:  Procedure Laterality Date  . SURGERY SCROTAL / TESTICULAR      FAMILY HISTORY:  Family History  Problem Relation Age of Onset  . Lymphoma Brother     SOCIAL HISTORY:  Social History   Socioeconomic History  . Marital status: Married    Spouse name: Not on file  . Number of children: Not on file  . Years of education: Not on file  . Highest education level: Not on file  Occupational History  . Not on file  Social Needs  . Financial resource strain: Not on file  . Food insecurity:    Worry: Not on file    Inability: Not on file  . Transportation needs:    Medical: Not  on file    Non-medical: Not on file  Tobacco Use  . Smoking status: Current Every Day Smoker    Packs/day: 2.00    Years: 30.00    Pack years: 60.00  . Smokeless tobacco: Never Used  Substance and Sexual Activity  . Alcohol use: No  . Drug use: No  . Sexual activity: Not on file  Lifestyle  . Physical activity:    Days per week: Not on file    Minutes per session: Not on file  . Stress: Not on file  Relationships  . Social connections:    Talks on phone: Not on file    Gets together: Not on file    Attends religious service: Not on file    Active member of club or organization: Not on file    Attends meetings of clubs or organizations: Not on file    Relationship status: Not on file  . Intimate partner violence:    Fear of current or ex partner: Not on file    Emotionally abused: Not on file    Physically abused: Not on file    Forced sexual activity: Not on file  Other Topics Concern  . Not on file  Social History Narrative  . Not on file    ALLERGIES: Patient has no known allergies.  MEDICATIONS:  No current facility-administered medications for  this encounter.    No current outpatient medications on file.   Facility-Administered Medications Ordered in Other Encounters  Medication Dose Route Frequency Provider Last Rate Last Dose  . bisacodyl (DULCOLAX) suppository 10 mg  10 mg Rectal Daily PRN Eulogio Bear U, DO      . cyclobenzaprine (FLEXERIL) tablet 10 mg  10 mg Oral TID PRN Rise Patience, MD   10 mg at 06/19/18 1236  . HYDROcodone-acetaminophen (NORCO/VICODIN) 5-325 MG per tablet 1-2 tablet  1-2 tablet Oral Q4H PRN Arne Cleveland, MD   2 tablet at 06/19/18 1236  . HYDROmorphone (DILAUDID) injection 1-2 mg  1-2 mg Intravenous Q4H PRN Eulogio Bear U, DO   2 mg at 06/19/18 1035  . ipratropium-albuterol (DUONEB) 0.5-2.5 (3) MG/3ML nebulizer solution 3 mL  3 mL Nebulization Q4H PRN Donne Hazel, MD      . magnesium oxide (MAG-OX) tablet 400 mg  400 mg  Oral Daily Rise Patience, MD   400 mg at 06/19/18 1035  . ondansetron (ZOFRAN) tablet 4 mg  4 mg Oral Q6H PRN Rise Patience, MD       Or  . ondansetron [facility]) injection 4 mg  4 mg Intravenous Q6H PRN Rise Patience, MD      . oxyCODONE (OXYCONTIN) 12 hr tablet 10 mg  10 mg Oral Q12H Donne Hazel, MD   10 mg at 06/19/18 1035  . polyethylene glycol (MIRALAX / GLYCOLAX) packet 17 g  17 g Oral Daily PRN Vann, Jessica U, DO      . senna (SENOKOT) tablet 8.6 mg  1 tablet Oral Daily Vann, Jessica U, DO   8.6 mg at 06/19/18 1035    REVIEW OF SYSTEMS:  On review of systems, the patient reports that he is doing well overall. He denies any chest pain, shortness of breath, cough, fevers, chills, night sweats, unintended weight changes. He denies any bowel or bladder disturbances, and denies abdominal pain, nausea or vomiting. He denies any new musculoskeletal or joint aches or pains. A complete review of systems is obtained and is otherwise negative. He presents in a hospital bed with neck collar in place.    PHYSICAL EXAM:  Wt Readings from Last 3 Encounters:  06/17/18 129 lb 12.8 oz (58.9 kg)  10/28/11 132 lb (59.9 kg)  10/24/11 130 lb 6.4 oz (59.1 kg)   Temp Readings from Last 3 Encounters:  06/19/18 98.3 F (36.8 C) (Oral)  06/19/18 97.6 F (36.4 C) (Oral)  10/28/11 97.4 F (36.3 C) (Oral)   BP Readings from Last 3 Encounters:  06/19/18 (!) 126/97  06/19/18 112/83  10/28/11 121/76   Pulse Readings from Last 3 Encounters:  06/19/18 (!) 110  06/19/18 96  10/28/11 94    /10  In general this is a well appearing Caucasian gentleman in no acute distress. He is alert and oriented x4 and appropriate throughout the examination. HEENT reveals that the patient is normocephalic, atraumatic. EOMs are intact. PERRLA. Skin is intact without any evidence of gross lesions. Cardiovascular exam reveals a regular rate and rhythm, no clicks rubs or murmurs are auscultated. Chest is  clear to auscultation bilaterally. Lymphatic assessment is performed and does not reveal any adenopathy in the cervical, supraclavicular, axillary, or inguinal chains. Abdomen has active bowel sounds in all quadrants and is intact. The abdomen is soft, non tender, non distended. Lower extremities are negative for pretibial pitting edema, deep calf tenderness, cyanosis or clubbing.   KPS = 60  100 - Normal; no complaints; no evidence of disease. 90   - Able to carry on normal activity; minor signs or symptoms of disease. 80   - Normal activity with effort; some signs or symptoms of disease. 54   - Cares for self; unable to carry on normal activity or to do active work. 60   - Requires occasional assistance, but is able to care for most of his personal needs. 50   - Requires considerable assistance and frequent medical care. 58   - Disabled; requires special care and assistance. 57   - Severely disabled; hospital admission is indicated although death not imminent. 34   - Very sick; hospital admission necessary; active supportive treatment necessary. 10   - Moribund; fatal processes progressing rapidly. 0     - Dead  Karnofsky DA, [place], Craver LS and Burchenal [facility] [phone]) The use of the nitrogen mustards in the palliative treatment of carcinoma: with particular reference to bronchogenic carcinoma Cancer 1 [id number]  LABORATORY DATA:  Lab Results  Component Value Date   WBC 9.5 06/18/2018   HGB 14.3 06/18/2018   HCT 41.6 06/18/2018   MCV 91.6 06/18/2018   PLT 288 06/18/2018   Lab Results  Component Value Date   NA 135 06/18/2018   K 3.5 06/18/2018   CL 102 06/18/2018   CO2 25 06/18/2018   Lab Results  Component Value Date   ALT 11 06/17/2018   AST 16 06/17/2018   ALKPHOS 93 06/17/2018   BILITOT 0.8 06/17/2018     RADIOGRAPHY: Ct Chest W Contrast  Result Date: 06/16/2018 CLINICAL DATA:  Bones/soft tissue sarcoma, metastasis screening. Neck pain for months. EXAM: CT CHEST,  ABDOMEN, AND PELVIS WITH CONTRAST TECHNIQUE: Multidetector CT imaging of the chest, abdomen and pelvis was performed following the standard protocol during bolus administration of intravenous contrast. CONTRAST:  157mL OMNIPAQUE IOHEXOL 300 MG/ML  SOLN COMPARISON:  None. FINDINGS: CT CHEST FINDINGS Cardiovascular: Heart size is normal. No pericardial effusion. No thoracic aortic aneurysm. Mild aortic atherosclerosis. Diffuse coronary artery calcifications, particularly dense within the LEFT anterior descending coronary artery. Mediastinum/Nodes: Enlarged and morphologically abnormal lymph nodes are seen within the mediastinum and perihilar regions, consistent with metastasis. This includes a low-density enlarged lymph node in the RIGHT hilum, measuring 2.1 cm short axis (series 3, image 41), an enlarged lymph node in the subcarinal space measuring 2.1 cm short axis (series 3, image 39) and a morphologically abnormal low-density lymph node within the RIGHT lower paratracheal space measuring 1.3 cm short axis (series 3, image 28). Esophagus is unremarkable. Trachea appears normal. Lungs/Pleura: Advanced emphysematous changes bilaterally, upper lobe predominant. 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 Spiculated pulmonary nodule within the RIGHT lower lobe measuring 9 mm (series 5, image 95). Prominent parabronchial thickening involving the bronchial segments to the RIGHT lower lobe, likely lymphangitic extension of the perihilar lymph node metastases. Associated narrowing of the RIGHT lower lobe and middle lobe bronchi, without occlusion. Musculoskeletal: Soft tissue mass involving the upper RIGHT scapula, extending into the lower neck, measuring 5.5 x 5.4 cm (series 3, image 8). Additional neoplastic masses involving the posterior RIGHT sixth and seventh ribs, measuring 2.9 cm and 3 cm respectively (series 3, image 29). Additional neoplastic mass  involving the posterior RIGHT tenth rib, measuring 4 cm (series 3, image 48). CT ABDOMEN PELVIS FINDINGS Hepatobiliary: No focal liver abnormality is seen. No gallstones, gallbladder wall thickening, or biliary dilatation. Pancreas: Unremarkable. No pancreatic ductal dilatation  or surrounding inflammatory changes. Spleen: Normal in size without focal abnormality. Adrenals/Urinary Tract: RIGHT adrenal mass measures 1.7 cm. LEFT adrenal gland is enlarged without discrete mass. Kidneys are unremarkable without mass, stone or hydronephrosis. Bladder appears normal. Stomach/Bowel: No dilated large or small bowel loops. No evidence of focal bowel wall thickening or bowel wall inflammation. Appendix is normal. Stomach is unremarkable. Vascular/Lymphatic: Aortic atherosclerosis. No acute appearing vascular abnormality. No enlarged lymph nodes appreciated. Mildly prominent lymph nodes within the periaortic retroperitoneum are suspicious for additional metastatic disease. Reproductive: Prostate is unremarkable. Other: No free fluid or abscess collection. No free intraperitoneal air. Musculoskeletal: Neoplastic soft tissue mass involving the RIGHT lateral half of the L4 vertebral body, with probable associated pathologic fractures, the soft tissue component measuring 4.7 x 4.7 cm (series 3, image 87). The mass involves the RIGHT pedicle. No convincing extension of the mass into the central canal. Mass does involve the RIGHT neural foramen with probable associated nerve root impingement. IMPRESSION: 1. Soft tissue mass involving the upper RIGHT scapula, extending into the lower neck, measuring 5.5 x 5.4 cm, consistent with metastatic disease. 2. Additional destructive neoplastic masses involving the posterior RIGHT sixth and seventh ribs, measuring 2.9 cm and 3 cm respectively. 3. Additional destructive neoplastic mass involving the posterior RIGHT tenth rib, measuring 4 cm. 4. Enlarged and morphologically abnormal lymph nodes  within the mediastinum and perihilar regions, consistent with metastatic disease. 5. Spiculated pulmonary nodule within the RIGHT lower lobe, measuring 9 mm, also suspicious for metastatic disease. 6. Prominent parabronchial thickening involving the bronchial segments to the RIGHT lower lobe, likely lymphangitic extension of the perihilar lymph node metastases. Associated narrowing of the RIGHT lower lobe and middle lobe bronchi, without occlusion. 7. Mildly prominent lymph nodes within the periaortic retroperitoneum, 1 of which lies posterior to the IVC and is highly suspicious for additional metastatic disease. 8. RIGHT adrenal mass measuring 1.7 cm, suspicious for adrenal metastasis. 9. Neoplastic soft tissue mass involving the RIGHT lateral half of the L4 vertebral body, with associated destruction of the vertebral body and with probable associated pathologic fractures, measuring approximately 4 cm greatest dimension. This mass involves the proximal portion of the RIGHT pedicle and extends to the RIGHT neural foramen with probable associated nerve root compression. 10. Advanced emphysema. 11. Asymmetric interstitial prominence within the RIGHT upper lobe, suspicious for lymphangitis carcinomatosis. 12. Diffuse coronary artery calcifications, particularly dense within the LEFT anterior descending coronary artery. Aortic Atherosclerosis (ICD10-I70.0) and Emphysema (ICD10-J43.9). Electronically Signed   By: Franki Cabot M.D.   On: 06/16/2018 23:50   Ct Cervical Spine Wo Contrast  Result Date: 06/16/2018 CLINICAL DATA:  Neck pain months.  Right-sided numbness and tingling EXAM: CT CERVICAL SPINE WITHOUT CONTRAST TECHNIQUE: Multidetector CT imaging of the cervical spine was performed without intravenous contrast. Multiplanar CT image reconstructions were also generated. COMPARISON:  Cervical spine MRI 06/16/2018 FINDINGS: Alignment: There is grade 1 retrolisthesis at the C2-C3 level. Alignment is otherwise  normal. Skull base and vertebrae: There is a destructive lesion of the lower aspect of the C2 vertebral body that extends into the right-sided posterior elements. On axial images, the soft tissue mass measures 3.3 cm transverse by 2.9 cm AP. There is retropulsion/epidural extension as demonstrated on the concomitant MRI. The mass extends into the right neural foramen there is also destruction of the right posterosuperior corner of C3. No lower cervical vertebral lesions. Soft tissues and spinal canal: There is soft tissue thickening anterior to C2. Disc levels: Disc space narrowing  is greatest at C2-3 and C6-7. As above, there is retropulsion at C2. This effaces the ventral thecal sac and causes mild spinal canal stenosis. Upper chest: Biapical emphysema. Other: None IMPRESSION: 1. Soft tissue mass of the lower aspect of C2, with associated osseous destruction, most consistent with plasmacytoma or metastasis. 4 mm of retropulsion/posterior epidural extension with associated mild spinal canal stenosis, better characterized on concomitant MRI. The mass extends into the right neural foramen. 2. Mild destruction of the posterosuperior corner of C3. 3. Biapical emphysema.  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 Electronically Signed   By: Ulyses Jarred M.D.   On: 06/16/2018 23:46   Ct Abdomen Pelvis W Contrast  Result Date: 06/16/2018 CLINICAL DATA:  Bones/soft tissue sarcoma, metastasis screening. Neck pain for months. EXAM: CT CHEST, ABDOMEN, AND PELVIS WITH CONTRAST TECHNIQUE: Multidetector CT imaging of the chest, abdomen and pelvis was performed following the standard protocol during bolus administration of intravenous contrast. CONTRAST:  166mL OMNIPAQUE IOHEXOL 300 MG/ML  SOLN COMPARISON:  None. FINDINGS: CT CHEST FINDINGS Cardiovascular: Heart size is normal. No pericardial effusion. No thoracic aortic aneurysm. Mild aortic atherosclerosis. Diffuse coronary artery calcifications, particularly dense within the LEFT anterior  descending coronary artery. Mediastinum/Nodes: Enlarged and morphologically abnormal lymph nodes are seen within the mediastinum and perihilar regions, consistent with metastasis. This includes a low-density enlarged lymph node in the RIGHT hilum, measuring 2.1 cm short axis (series 3, image 41), an enlarged lymph node in the subcarinal space measuring 2.1 cm short axis (series 3, image 39) and a morphologically abnormal low-density lymph node within the RIGHT lower paratracheal space measuring 1.3 cm short axis (series 3, image 28). Esophagus is unremarkable. Trachea appears normal. Lungs/Pleura: Advanced emphysematous changes bilaterally, upper lobe predominant. 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 Spiculated pulmonary nodule within the RIGHT lower lobe measuring 9 mm (series 5, image 95). Prominent parabronchial thickening involving the bronchial segments to the RIGHT lower lobe, likely lymphangitic extension of the perihilar lymph node metastases. Associated narrowing of the RIGHT lower lobe and middle lobe bronchi, without occlusion. Musculoskeletal: Soft tissue mass involving the upper RIGHT scapula, extending into the lower neck, measuring 5.5 x 5.4 cm (series 3, image 8). Additional neoplastic masses involving the posterior RIGHT sixth and seventh ribs, measuring 2.9 cm and 3 cm respectively (series 3, image 29). Additional neoplastic mass involving the posterior RIGHT tenth rib, measuring 4 cm (series 3, image 48). CT ABDOMEN PELVIS FINDINGS Hepatobiliary: No focal liver abnormality is seen. No gallstones, gallbladder wall thickening, or biliary dilatation. Pancreas: Unremarkable. No pancreatic ductal dilatation or surrounding inflammatory changes. Spleen: Normal in size without focal abnormality. Adrenals/Urinary Tract: RIGHT adrenal mass measures 1.7 cm. LEFT adrenal gland is enlarged without discrete mass. Kidneys are unremarkable without  mass, stone or hydronephrosis. Bladder appears normal. Stomach/Bowel: No dilated large or small bowel loops. No evidence of focal bowel wall thickening or bowel wall inflammation. Appendix is normal. Stomach is unremarkable. Vascular/Lymphatic: Aortic atherosclerosis. No acute appearing vascular abnormality. No enlarged lymph nodes appreciated. Mildly prominent lymph nodes within the periaortic retroperitoneum are suspicious for additional metastatic disease. Reproductive: Prostate is unremarkable. Other: No free fluid or abscess collection. No free intraperitoneal air. Musculoskeletal: Neoplastic soft tissue mass involving the RIGHT lateral half of the L4 vertebral body, with probable associated pathologic fractures, the soft tissue component measuring 4.7 x 4.7 cm (series 3, image 87). The mass involves the RIGHT pedicle. No convincing extension of the mass into the central canal. Mass does involve the RIGHT  neural foramen with probable associated nerve root impingement. IMPRESSION: 1. Soft tissue mass involving the upper RIGHT scapula, extending into the lower neck, measuring 5.5 x 5.4 cm, consistent with metastatic disease. 2. Additional destructive neoplastic masses involving the posterior RIGHT sixth and seventh ribs, measuring 2.9 cm and 3 cm respectively. 3. Additional destructive neoplastic mass involving the posterior RIGHT tenth rib, measuring 4 cm. 4. Enlarged and morphologically abnormal lymph nodes within the mediastinum and perihilar regions, consistent with metastatic disease. 5. Spiculated pulmonary nodule within the RIGHT lower lobe, measuring 9 mm, also suspicious for metastatic disease. 6. Prominent parabronchial thickening involving the bronchial segments to the RIGHT lower lobe, likely lymphangitic extension of the perihilar lymph node metastases. Associated narrowing of the RIGHT lower lobe and middle lobe bronchi, without occlusion. 7. Mildly prominent lymph nodes within the periaortic  retroperitoneum, 1 of which lies posterior to the IVC and is highly suspicious for additional metastatic disease. 8. RIGHT adrenal mass measuring 1.7 cm, suspicious for adrenal metastasis. 9. Neoplastic soft tissue mass involving the RIGHT lateral half of the L4 vertebral body, with associated destruction of the vertebral body and with probable associated pathologic fractures, measuring approximately 4 cm greatest dimension. This mass involves the proximal portion of the RIGHT pedicle and extends to the RIGHT neural foramen with probable associated nerve root compression. 10. Advanced emphysema. 11. Asymmetric interstitial prominence within the RIGHT upper lobe, suspicious for lymphangitis carcinomatosis. 12. Diffuse coronary artery calcifications, particularly dense within the LEFT anterior descending coronary artery. Aortic Atherosclerosis (ICD10-I70.0) and Emphysema (ICD10-J43.9). Electronically Signed   By: Franki Cabot M.D.   On: 06/16/2018 23:50   Ct Biopsy  Result Date: 06/19/2018 CLINICAL DATA:  Multiple bone metastases, no known primary EXAM: CT GUIDED CORE BIOPSY OF   RIGHT SCAPULAR LESION ANESTHESIA/SEDATION: Intravenous Fentanyl and Versed were administered as conscious sedation during continuous monitoring of the patient's level of consciousness and physiological / cardiorespiratory status by the radiology RN, with a total moderate sedation time of 10 minutes. PROCEDURE: The procedure risks, benefits, and alternatives were explained to the patient. Questions regarding the procedure were encouraged and answered. The patient understands and consents to the procedure. patient placed in left lateral decubitus positioning. select axial scans through the upper thorax obtained. the exophytic soft tissue mass from the right scapula was identified and an appropriate skin entry site was determined and marked. The operative field was prepped with chlorhexidinein a sterile fashion, and a sterile drape was  applied covering the operative field. A sterile gown and sterile gloves were used for the procedure. Local anesthesia was provided with 1% Lidocaine. Under CT fluoroscopic guidance, a 17 gauge trocar needle was advanced to the margin of the lesion. Once needle tip position was confirmed, coaxial 18-gauge core biopsy samples were obtained, submitted in formalin to surgical pathology. The guide needle was removed. Postprocedure scans show no hemorrhage or other apparent complication. COMPLICATIONS: None immediate FINDINGS: Soft tissue attenuation mass exophytic from the superior aspect of the right scapula was localized. Representative core biopsy samples obtained as above. IMPRESSION: 1. Technically successful CT-guided core biopsy, right scapular mass. Electronically Signed   By: Lucrezia Europe M.D.   On: 06/19/2018 11:27   Dg Chest Port 1 View  Result Date: 06/19/2018 CLINICAL DATA:  Sudden onset of hypoxia and need of oxygen this morning EXAM: PORTABLE CHEST - 1 VIEW COMPARISON:  CT 06/16/2018 FINDINGS: Bilateral interstitial edema or infiltrates. Patchy airspace opacities in the right mid and lower lung. Underlying pulmonary emphysema.  Heart size normal. Right hilar fullness consistent with known adenopathy. No effusion.  No pneumothorax. Lytic lesion involving posterior aspect right sixth rib IMPRESSION: 1. Asymmetric interstitial and airspace infiltrates or edema, right greater than left. Electronically Signed   By: Lucrezia Europe M.D.   On: 06/19/2018 12:10      IMPRESSION/PLAN: 1. 57 y.o. gentleman with a C2 metastasis from widely metastatic cancer pending biopsy.  Today, I talked to the patient and family about the findings and work-up thus far.  We discussed the natural history of spinal metastases and general treatment, highlighting the role of radiotherapy in the management.  We discussed the available radiation techniques, and focused on the details of logistics and delivery.  We reviewed the  anticipated acute and late sequelae associated with radiation in this setting.  The patient was encouraged to ask questions that I answered to the best of my ability.    The patient would like to proceed with radiation and will be scheduled for CT simulation.  I spent 60 minutes minutes face to face with the patient and more than 50% of that time was spent in counseling and/or coordination of care.   ------------------------------------------------   Tyler Pita, MD [place] Director and Director of Stereotactic Radiosurgery Direct Dial: [phone]  Fax: [phone] [email or web address]  Skype  [email or web address]   This document serves as a record of services personally performed by Tyler Pita, MD. It was created on his behalf by Wilburn Mylar, a trained medical scribe. The creation of this record is based on the scribe's personal observations and the provider's statements to them. This document has been checked and approved by the attending provider.

## 2018-06-19 NOTE — Progress Notes (Signed)
SATURATION QUALIFICATIONS: (This note is used to comply with regulatory documentation for home oxygen)  Patient Saturations on Room Air at Rest = 88%  Patient Saturations on Room Air while Ambulating = 80%  Patient Saturations on 2 Liters of oxygen while Ambulating = 92%  Please briefly explain why patient needs home oxygen:

## 2018-06-19 NOTE — Progress Notes (Signed)
  Radiation Oncology         [phone] ________________________________  Name: Timothy Wade MRN: [id number]  Date: 06/19/2018  DOB: 1961/03/14  SIMULATION AND TREATMENT PLANNING NOTE    ICD-10-CM   1. Metastatic cancer ([place]) C79.9     DIAGNOSIS:  57 y.o. gentleman with C2 metastasis pending biopsy  NARRATIVE:  The patient was brought to the [facility].  Identity was confirmed.  All relevant records and images related to the planned course of therapy were reviewed.  The patient freely provided informed written consent to proceed with treatment after reviewing the details related to the planned course of therapy. The consent form was witnessed and verified by the simulation staff.  Then, the patient was set-up in a stable reproducible  supine position for radiation therapy.  CT images were obtained.  Surface markings were placed.  The CT images were loaded into the planning software.  Then the target and avoidance structures were contoured.  Treatment planning then occurred.  The radiation prescription was entered and confirmed.  Then, I designed and supervised the construction of a total of 5 medically necessary complex treatment devices including mask and 4 MLCs to shield the brain and parotids.  I have requested : Isodose Plan.  PLAN:  The patient will receive 30 Gy in 10 fractions.  ________________________________  Sheral Apley Tammi Klippel, M.D.  This document serves as a record of services personally performed by Tyler Pita, MD. It was created on his behalf by Wilburn Mylar, a trained medical scribe. The creation of this record is based on the scribe's personal observations and the provider's statements to them. This document has been checked and approved by the attending provider.

## 2018-06-19 NOTE — Procedures (Signed)
  Procedure: CT R scapular mass core bx 18g x3 EBL:   minimal Complications:  none immediate  See full dictation in [facility].  Dillard Cannon MD Main # [phone] Pager  [phone]

## 2018-06-19 NOTE — Progress Notes (Signed)
PROGRESS NOTE    Timothy Wade  GNF:[id number] DOB: Oct 14, 1960 DOA: 06/16/2018 PCP: Barnie Mort, NP    Brief Narrative:  57 y.o. male withhistory of tobacco abuse has been experiencing neck pain for the last 1 month. Had MRI of the C-spine done as outpatient which showed destructive lesion of the C2-C3 concerning for malignancy or metastatic disease. Patient was referred to the ER. Denies any chest pain shortness of breath or any weakness of upper or lower extremities or any incontinence of urine or bowel.  Assessment & Plan:   Active Problems:   Neck pain   Neck mass   Metastatic cancer (HCC)  Probable metastatic cancer: - pain control -bowel regimen - biopsy obtained 11/25, pending results -Radiation Oncology consulted -would have patient follow up with Oncology as scheduled  Numbness and tingling due to C2 and C3 metastatic lesion -continue cervical collar -neurosurgery was consulted, recommendations for Radiation oncology, per above  Tobacco abuse -cessation done at bedside  Acute hypoxemic respiratory failure -O2 requirements worsened from RA to [facility] this AM -CXR ordered and personally reviewed. Findings of emphysema with pulm edema noted -Will order 2d echo -Will give trial of IV lasix -Will check BNP  DVT prophylaxis: SCD's Code Status: Full Family Communication: Pt in room, family at bedside Disposition Plan: Uncertain at this time  Consultants:     Procedures:     Antimicrobials: Anti-infectives (From admission, onward)   None       Subjective: Without complaints at this time  Objective: Vitals:   06/19/18 0955 06/19/18 1000 06/19/18 1100 06/19/18 1130  BP: (!) 137/115 (!) 138/95 113/82 (!) 126/97  Pulse: (!) 101 (!) 101 (!) 104 (!) 110  Resp: 16 16 18 18   Temp:   98 F (36.7 C) 98.3 F (36.8 C)  TempSrc:   Oral Oral  SpO2: 95% 92% 94% 98%  Weight:      Height:        Intake/Output Summary (Last 24 hours) at 06/19/2018  1743 Last data filed at 06/19/2018 1207 Gross per 24 hour  Intake 360 ml  Output -  Net 360 ml   Filed Weights   06/16/18 2353 06/17/18 1955  Weight: 60 kg 58.9 kg    Examination:  General exam: Appears calm and comfortable, neck colar in place Respiratory system: Clear to auscultation. Respiratory effort normal. Cardiovascular system: S1 & S2 heard, RRR Gastrointestinal system: Abdomen is nondistended, soft and nontender. No organomegaly or masses felt. Normal bowel sounds heard. Central nervous system: Alert and oriented. No focal neurological deficits. Extremities: Symmetric 5 x 5 power. Skin: No rashes, lesions  Psychiatry: Judgement and insight appear normal. Mood & affect appropriate.   Data Reviewed: I have personally reviewed following labs and imaging studies  CBC: Recent Labs  Lab 06/16/18 2049 06/16/18 2100 06/17/18 0313 06/18/18 0235  WBC 9.7  --  9.9 9.5  NEUTROABS 6.1  --   --   --   HGB 14.1 13.9 14.5 14.3  HCT 42.6 41.0 42.4 41.6  MCV 94.9  --  92.8 91.6  PLT 285  --  284 846   Basic Metabolic Panel: Recent Labs  Lab 06/16/18 2049 06/16/18 2100 06/17/18 0313 06/18/18 0235  NA 134* 139 136 135  K 3.1* 3.2* 3.1* 3.5  CL 101 101 101 102  CO2 26  --  26 25  GLUCOSE 96 90 93 95  BUN 18 21* 15 13  CREATININE 0.95 0.80 0.95 0.76  CALCIUM 9.5  --  9.5 9.5   GFR: Estimated Creatinine Clearance: 85.9 mL/min (by C-G formula based on SCr of 0.76 mg/dL). Liver Function Tests: Recent Labs  Lab 06/17/18 0710  AST 16  ALT 11  ALKPHOS 93  BILITOT 0.8  PROT 6.9  ALBUMIN 3.8   No results for input(s): LIPASE, AMYLASE in the last 168 hours. No results for input(s): AMMONIA in the last 168 hours. Coagulation Profile: Recent Labs  Lab 06/20/18 0539  INR 1.01   Cardiac Enzymes: No results for input(s): CKTOTAL, CKMB, CKMBINDEX, TROPONINI in the last 168 hours. BNP (last 3 results) No results for input(s): PROBNP in the last 8760  hours. HbA1C: No results for input(s): HGBA1C in the last 72 hours. CBG: No results for input(s): GLUCAP in the last 168 hours. Lipid Profile: No results for input(s): CHOL, HDL, LDLCALC, TRIG, CHOLHDL, LDLDIRECT in the last 72 hours. Thyroid Function Tests: No results for input(s): TSH, T4TOTAL, FREET4, T3FREE, THYROIDAB in the last 72 hours. Anemia Panel: No results for input(s): VITAMINB12, FOLATE, FERRITIN, TIBC, IRON, RETICCTPCT in the last 72 hours. Sepsis Labs: No results for input(s): PROCALCITON, LATICACIDVEN in the last 168 hours.  No results found for this or any previous visit (from the past 240 hour(s)).   Radiology Studies: Ct Biopsy  Result Date: June 20, 2018 CLINICAL DATA:  Multiple bone metastases, no known primary EXAM: CT GUIDED CORE BIOPSY OF   RIGHT SCAPULAR LESION ANESTHESIA/SEDATION: Intravenous Fentanyl and Versed were administered as conscious sedation during continuous monitoring of the patient's level of consciousness and physiological / cardiorespiratory status by the radiology RN, with a total moderate sedation time of 10 minutes. PROCEDURE: The procedure risks, benefits, and alternatives were explained to the patient. Questions regarding the procedure were encouraged and answered. The patient understands and consents to the procedure. patient placed in left lateral decubitus positioning. select axial scans through the upper thorax obtained. the exophytic soft tissue mass from the right scapula was identified and an appropriate skin entry site was determined and marked. The operative field was prepped with chlorhexidinein a sterile fashion, and a sterile drape was applied covering the operative field. A sterile gown and sterile gloves were used for the procedure. Local anesthesia was provided with 1% Lidocaine. Under CT fluoroscopic guidance, a 17 gauge trocar needle was advanced to the margin of the lesion. Once needle tip position was confirmed, coaxial 18-gauge core  biopsy samples were obtained, submitted in formalin to surgical pathology. The guide needle was removed. Postprocedure scans show no hemorrhage or other apparent complication. COMPLICATIONS: None immediate FINDINGS: Soft tissue attenuation mass exophytic from the superior aspect of the right scapula was localized. Representative core biopsy samples obtained as above. IMPRESSION: 1. Technically successful CT-guided core biopsy, right scapular mass. Electronically Signed   By: Lucrezia Europe M.D.   On: 06-20-18 11:27   Dg Chest Port 1 View  Result Date: 20-Jun-2018 CLINICAL DATA:  Sudden onset of hypoxia and need of oxygen this morning EXAM: PORTABLE CHEST - 1 VIEW COMPARISON:  CT 06/16/2018 FINDINGS: Bilateral interstitial edema or infiltrates. Patchy airspace opacities in the right mid and lower lung. Underlying pulmonary emphysema. Heart size normal. Right hilar fullness consistent with known adenopathy. No effusion.  No pneumothorax. Lytic lesion involving posterior aspect right sixth rib IMPRESSION: 1. Asymmetric interstitial and airspace infiltrates or edema, right greater than left. Electronically Signed   By: Lucrezia Europe M.D.   On: 06/20/2018 12:10    Scheduled Meds: . furosemide  40 mg Intravenous Daily  .  magnesium oxide  400 mg Oral Daily  . oxyCODONE  10 mg Oral Q12H  . senna  1 tablet Oral Daily   Continuous Infusions:   LOS: 1 day   Marylu Lund, MD [facility] Pager On Amion  If 7PM-7AM, please contact night-coverage 06/19/2018, 5:43 PM

## 2018-06-20 ENCOUNTER — Inpatient Hospital Stay (HOSPITAL_COMMUNITY): Payer: Managed Care, Other (non HMO)

## 2018-06-20 ENCOUNTER — Ambulatory Visit
Admit: 2018-06-20 | Discharge: 2018-06-20 | Disposition: A | Discharge status: Home / Self Care | Payer: Managed Care, Other (non HMO) | Attending: Radiation Oncology | Admitting: Radiation Oncology

## 2018-06-20 ENCOUNTER — Other Ambulatory Visit (HOSPITAL_COMMUNITY): Payer: Managed Care, Other (non HMO)

## 2018-06-20 DIAGNOSIS — I361 Nonrheumatic tricuspid (valve) insufficiency: Secondary | ICD-10-CM

## 2018-06-20 LAB — BASIC METABOLIC PANEL
Anion gap: 12 (ref 5–15)
BUN: 11 mg/dL (ref 6–20)
CHLORIDE: 97 mmol/L — AB (ref 98–111)
CO2: 27 mmol/L (ref 22–32)
Calcium: 10.1 mg/dL (ref 8.9–10.3)
Creatinine, Ser: 0.91 mg/dL (ref 0.61–1.24)
GFR calc Af Amer: >60 mL/min (ref >60)
GFR calc non Af Amer: >60 mL/min (ref >60)
Glucose, Bld: 102 mg/dL — ABNORMAL HIGH (ref 70–99)
POTASSIUM: 3.7 mmol/L (ref 3.5–5.1)
SODIUM: 136 mmol/L (ref 135–145)

## 2018-06-20 LAB — ECHOCARDIOGRAM COMPLETE
Height: 67 in
Weight: 2076.8 oz

## 2018-06-20 MED ORDER — ALBUTEROL SULFATE HFA 108 (90 BASE) MCG/ACT IN AERS: 2.0000 | INHALATION_SPRAY | Freq: Four times a day (QID) | RESPIRATORY_TRACT | 0 refills | Status: AC | PRN

## 2018-06-20 MED ORDER — OXYCODONE HCL ER 10 MG PO T12A: 10.0000 mg | EXTENDED_RELEASE_TABLET | Freq: Two times a day (BID) | ORAL | 0 refills | Status: AC

## 2018-06-20 MED ORDER — HYDROCODONE-ACETAMINOPHEN 5-325 MG PO TABS: 1.0000 | ORAL_TABLET | ORAL | 0 refills | Status: DC | PRN

## 2018-06-20 MED ORDER — SENNA 8.6 MG PO TABS: 1.0000 | ORAL_TABLET | Freq: Every day | ORAL | 0 refills | Status: AC

## 2018-06-20 NOTE — Progress Notes (Signed)
Timothy Wade to be D/C'd  per MD order. Discussed with the patient and all questions fully answered.  VSS, Skin clean, dry and intact without evidence of skin break down, no evidence of skin tears noted.  IV catheter discontinued intact. Site without signs and symptoms of complications. Dressing and pressure applied.  An After Visit Summary was printed and given to the patient. Patient received prescription.  D/c education completed with patient/family including follow up instructions, medication list, d/c activities limitations if indicated, with other d/c instructions as indicated by MD - patient able to verbalize understanding, all questions fully answered.   Patient instructed to return to ED, call 911, or call MD for any changes in condition.   Patient to be escorted via [place], and D/C home via private auto.

## 2018-06-20 NOTE — Progress Notes (Signed)
  Echocardiogram 2D Echocardiogram has been performed attempted. Patient leaving. Nurse stated to come back in 2 hrs. Will reattempt later.  Evrett Hakim G Balthazar Dooly 06/20/2018, 11:50 AM

## 2018-06-20 NOTE — Plan of Care (Signed)
  Problem: Clinical Measurements: Goal: Will remain free from infection Outcome: Progressing   Problem: Activity: Goal: Risk for activity intolerance will decrease Outcome: Progressing   Problem: Coping: Goal: Level of anxiety will decrease Outcome: Progressing

## 2018-06-20 NOTE — Progress Notes (Signed)
[facility] Therapy Treatment Record Phone [phone]   Radiation Therapy was administered to Timothy Wade on: 06/20/2018  2:20 PM and was treatment # 1 out of a planned course of 10 treatments.  Radiation Treatment  1). Beam photons with 6-10 energy  2). Brachytherapy None  3). Stereotactic Radiosurgery None  4). Other Radiation None     Tosca Pletz F Meika Earll, RT (T)

## 2018-06-20 NOTE — Progress Notes (Signed)
SATURATION QUALIFICATIONS: (This note is used to comply with regulatory documentation for home oxygen)  Patient Saturations on Room Air at Rest =94%  Patient Saturations on Room Air while Ambulating = 90%  Patient Saturations on0 Liters of oxygen while Ambulating =90%  Please briefly explain why patient needs home oxygen:

## 2018-06-20 NOTE — Progress Notes (Signed)
Overall stabl collar.  We will assess his stability as he recovers from his radiation treatment.  E.  Awaiting pathology results.  I continue to recommend palliative radiation to his cervical and lumbar spine.  Patient should continue with his cervical

## 2018-06-20 NOTE — Progress Notes (Signed)
Patient transported to Cancer center for radiology

## 2018-06-20 NOTE — Care Management Note (Signed)
Case Management Note  Patient Details  Name: Timothy Wade MRN: [id number] Date of Birth: 02/11/1961  Subjective/Objective:                    Action/Plan:  Was consulted for possible home oxygen. However, per home oxygen saturation note, patient does not qualify for home oxygen. Will continue to follow for discharge needs. Expected Discharge Date:                  Expected Discharge Plan:  Home/Self Care  In-House Referral:     Discharge planning Services  CM Consult  Post Acute Care Choice:  Durable Medical Equipment Choice offered to:     DME Arranged:    DME Agency:     HH Arranged:    [place] Agency:     Status of Service:  In process, will continue to follow  If discussed at Long Length of Stay Meetings, dates discussed:    Additional Comments:  Marilu Favre, RN 06/20/2018, 11:26 AM

## 2018-06-20 NOTE — Progress Notes (Signed)
  Echocardiogram 2D Echocardiogram has been performed.  Randa Lynn Faigy Stretch 06/20/2018, 4:12 PM

## 2018-06-20 NOTE — Discharge Summary (Signed)
Physician Discharge Summary  Timothy Wade ZOX:[id number] DOB: [birth year] DOA: 06/16/2018  PCP: Barnie Mort, NP  Admit date: 06/16/2018 Discharge date: 06/20/2018  Admitted From: Home Disposition:  Home  Recommendations for Outpatient Follow-up:  1. Follow up with PCP in 1-2 weeks 2. Follow up with Oncology as scheduled 3. Follow up with Radiation Oncology as scheduled 4. Follow up with Neurosurgery as scheduled 5. Please follow up on bone biopsy result  Discharge Condition:Stable CODE STATUS:Full Diet recommendation: Regular   Brief/Interim Summary: 57 y.o.malewithhistory of tobacco abuse has been experiencing neck pain for the last 1 month. Had MRI of the C-spine done as outpatient which showed destructive lesion of the C2-C3 concerning for malignancy or metastatic disease. Patient was referred to the ER. Denies any chest pain shortness of breath or any weakness of upper or lower extremities or any incontinence of urine or bowel.  Discharge Diagnoses:  Active Problems:   Neck pain   Neck mass   Metastatic cancer ([place])  Probable metastatic cancer: - continue with PRN vicodin and q12 oxycontin for new diagnosis of cancer -bowel regimen - biopsy obtained 11/25, pending results -Radiation Oncology following -would have patient follow up with Oncology as scheduled  Numbness and tingling due to C2 and C3 metastatic lesion -continue cervical collar -neurosurgery was consulted, recommendations for Radiation oncology -Completed 1/10 treatments 11/26  Tobacco abuse -cessation done at bedside  Acute hypoxemic respiratory failure possibly secondary to lung cancer -O2 requirements worsened from RA to [facility] this AM -CXR ordered and personally reviewed. Findings of emphysema with pulm edema noted -Ordered and reviewed 2d echo, normal LVEF   Discharge Instructions   Allergies as of 06/20/2018   No Known Allergies     Medication List    STOP taking these  medications   predniSONE 10 MG tablet Commonly known as:  DELTASONE     TAKE these medications   albuterol 108 (90 Base) MCG/ACT inhaler Commonly known as:  PROVENTIL HFA;VENTOLIN HFA Inhale 2 puffs into the lungs every 6 (six) hours as needed for wheezing or shortness of breath.   cyclobenzaprine 10 MG tablet Commonly known as:  FLEXERIL Take 10 mg by mouth every 12 (twelve) hours as needed for muscle spasms.   HYDROcodone-acetaminophen 5-325 MG tablet Commonly known as:  NORCO/VICODIN Take 1-2 tablets by mouth every 4 (four) hours as needed for severe pain.   magnesium oxide 400 MG tablet Commonly known as:  MAG-OX Take 400 mg by mouth daily.   naproxen 500 MG tablet Commonly known as:  NAPROSYN Take 500 mg by mouth every 12 (twelve) hours as needed for mild pain.   oxyCODONE 10 mg 12 hr tablet Commonly known as:  OXYCONTIN Take 1 tablet (10 mg total) by mouth every 12 (twelve) hours for 5 days.   senna 8.6 MG Tabs tablet Commonly known as:  SENOKOT Take 1 tablet (8.6 mg total) by mouth daily. Start taking on:  06/21/2018      Follow-up Information    Barnie Mort, NP. Schedule an appointment as soon as possible for a visit in 2 week(s).   Specialty:  Nurse Practitioner Contact information: [place] [address] [phone]        Ladell Pier, MD. Schedule an appointment as soon as possible for a visit.   Specialty:  Oncology Why:  as scheduled Contact information: [place] [address] [phone]        Follow up with your radiation treatments  as tolerated Follow up.          No Known Allergies  Consultations:  Radiation oncology  Neurosurgery  Procedures/Studies: Ct Chest W Contrast  Result Date: 06/16/2018 CLINICAL DATA:  Bones/soft tissue sarcoma, metastasis screening. Neck pain for months. EXAM: CT CHEST, ABDOMEN, AND PELVIS WITH CONTRAST TECHNIQUE: Multidetector CT imaging of the chest,  abdomen and pelvis was performed following the standard protocol during bolus administration of intravenous contrast. CONTRAST:  182mL OMNIPAQUE IOHEXOL 300 MG/ML  SOLN COMPARISON:  None. FINDINGS: CT CHEST FINDINGS Cardiovascular: Heart size is normal. No pericardial effusion. No thoracic aortic aneurysm. Mild aortic atherosclerosis. Diffuse coronary artery calcifications, particularly dense within the LEFT anterior descending coronary artery. Mediastinum/Nodes: Enlarged and morphologically abnormal lymph nodes are seen within the mediastinum and perihilar regions, consistent with metastasis. This includes a low-density enlarged lymph node in the RIGHT hilum, measuring 2.1 cm short axis (series 3, image 41), an enlarged lymph node in the subcarinal space measuring 2.1 cm short axis (series 3, image 39) and a morphologically abnormal low-density lymph node within the RIGHT lower paratracheal space measuring 1.3 cm short axis (series 3, image 28). Esophagus is unremarkable. Trachea appears normal. Lungs/Pleura: Advanced emphysematous changes bilaterally, upper lobe predominant. 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 Spiculated pulmonary nodule within the RIGHT lower lobe measuring 9 mm (series 5, image 95). Prominent parabronchial thickening involving the bronchial segments to the RIGHT lower lobe, likely lymphangitic extension of the perihilar lymph node metastases. Associated narrowing of the RIGHT lower lobe and middle lobe bronchi, without occlusion. Musculoskeletal: Soft tissue mass involving the upper RIGHT scapula, extending into the lower neck, measuring 5.5 x 5.4 cm (series 3, image 8). Additional neoplastic masses involving the posterior RIGHT sixth and seventh ribs, measuring 2.9 cm and 3 cm respectively (series 3, image 29). Additional neoplastic mass involving the posterior RIGHT tenth rib, measuring 4 cm (series 3, image 48). CT ABDOMEN  PELVIS FINDINGS Hepatobiliary: No focal liver abnormality is seen. No gallstones, gallbladder wall thickening, or biliary dilatation. Pancreas: Unremarkable. No pancreatic ductal dilatation or surrounding inflammatory changes. Spleen: Normal in size without focal abnormality. Adrenals/Urinary Tract: RIGHT adrenal mass measures 1.7 cm. LEFT adrenal gland is enlarged without discrete mass. Kidneys are unremarkable without mass, stone or hydronephrosis. Bladder appears normal. Stomach/Bowel: No dilated large or small bowel loops. No evidence of focal bowel wall thickening or bowel wall inflammation. Appendix is normal. Stomach is unremarkable. Vascular/Lymphatic: Aortic atherosclerosis. No acute appearing vascular abnormality. No enlarged lymph nodes appreciated. Mildly prominent lymph nodes within the periaortic retroperitoneum are suspicious for additional metastatic disease. Reproductive: Prostate is unremarkable. Other: No free fluid or abscess collection. No free intraperitoneal air. Musculoskeletal: Neoplastic soft tissue mass involving the RIGHT lateral half of the L4 vertebral body, with probable associated pathologic fractures, the soft tissue component measuring 4.7 x 4.7 cm (series 3, image 87). The mass involves the RIGHT pedicle. No convincing extension of the mass into the central canal. Mass does involve the RIGHT neural foramen with probable associated nerve root impingement. IMPRESSION: 1. Soft tissue mass involving the upper RIGHT scapula, extending into the lower neck, measuring 5.5 x 5.4 cm, consistent with metastatic disease. 2. Additional destructive neoplastic masses involving the posterior RIGHT sixth and seventh ribs, measuring 2.9 cm and 3 cm respectively. 3. Additional destructive neoplastic mass involving the posterior RIGHT tenth rib, measuring 4 cm. 4. Enlarged and morphologically abnormal lymph nodes within the mediastinum and perihilar regions, consistent with metastatic disease.  5.  Spiculated pulmonary nodule within the RIGHT lower lobe, measuring 9 mm, also suspicious for metastatic disease. 6. Prominent parabronchial thickening involving the bronchial segments to the RIGHT lower lobe, likely lymphangitic extension of the perihilar lymph node metastases. Associated narrowing of the RIGHT lower lobe and middle lobe bronchi, without occlusion. 7. Mildly prominent lymph nodes within the periaortic retroperitoneum, 1 of which lies posterior to the IVC and is highly suspicious for additional metastatic disease. 8. RIGHT adrenal mass measuring 1.7 cm, suspicious for adrenal metastasis. 9. Neoplastic soft tissue mass involving the RIGHT lateral half of the L4 vertebral body, with associated destruction of the vertebral body and with probable associated pathologic fractures, measuring approximately 4 cm greatest dimension. This mass involves the proximal portion of the RIGHT pedicle and extends to the RIGHT neural foramen with probable associated nerve root compression. 10. Advanced emphysema. 11. Asymmetric interstitial prominence within the RIGHT upper lobe, suspicious for lymphangitis carcinomatosis. 12. Diffuse coronary artery calcifications, particularly dense within the LEFT anterior descending coronary artery. Aortic Atherosclerosis (ICD10-I70.0) and Emphysema (ICD10-J43.9). Electronically Signed   By: Franki Cabot M.D.   On: 06/16/2018 23:50   Ct Cervical Spine Wo Contrast  Result Date: 06/16/2018 CLINICAL DATA:  Neck pain months.  Right-sided numbness and tingling EXAM: CT CERVICAL SPINE WITHOUT CONTRAST TECHNIQUE: Multidetector CT imaging of the cervical spine was performed without intravenous contrast. Multiplanar CT image reconstructions were also generated. COMPARISON:  Cervical spine MRI 06/16/2018 FINDINGS: Alignment: There is grade 1 retrolisthesis at the C2-C3 level. Alignment is otherwise normal. Skull base and vertebrae: There is a destructive lesion of the lower aspect of  the C2 vertebral body that extends into the right-sided posterior elements. On axial images, the soft tissue mass measures 3.3 cm transverse by 2.9 cm AP. There is retropulsion/epidural extension as demonstrated on the concomitant MRI. The mass extends into the right neural foramen there is also destruction of the right posterosuperior corner of C3. No lower cervical vertebral lesions. Soft tissues and spinal canal: There is soft tissue thickening anterior to C2. Disc levels: Disc space narrowing is greatest at C2-3 and C6-7. As above, there is retropulsion at C2. This effaces the ventral thecal sac and causes mild spinal canal stenosis. Upper chest: Biapical emphysema. Other: None IMPRESSION: 1. Soft tissue mass of the lower aspect of C2, with associated osseous destruction, most consistent with plasmacytoma or metastasis. 4 mm of retropulsion/posterior epidural extension with associated mild spinal canal stenosis, better characterized on concomitant MRI. The mass extends into the right neural foramen. 2. Mild destruction of the posterosuperior corner of C3. 3. Biapical emphysema.  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 Electronically Signed   By: Ulyses Jarred M.D.   On: 06/16/2018 23:46   Ct Abdomen Pelvis W Contrast  Result Date: 06/16/2018 CLINICAL DATA:  Bones/soft tissue sarcoma, metastasis screening. Neck pain for months. EXAM: CT CHEST, ABDOMEN, AND PELVIS WITH CONTRAST TECHNIQUE: Multidetector CT imaging of the chest, abdomen and pelvis was performed following the standard protocol during bolus administration of intravenous contrast. CONTRAST:  158mL OMNIPAQUE IOHEXOL 300 MG/ML  SOLN COMPARISON:  None. FINDINGS: CT CHEST FINDINGS Cardiovascular: Heart size is normal. No pericardial effusion. No thoracic aortic aneurysm. Mild aortic atherosclerosis. Diffuse coronary artery calcifications, particularly dense within the LEFT anterior descending coronary artery. Mediastinum/Nodes: Enlarged and morphologically abnormal lymph  nodes are seen within the mediastinum and perihilar regions, consistent with metastasis. This includes a low-density enlarged lymph node in the RIGHT hilum, measuring 2.1 cm short axis (series 3, image 41), an  enlarged lymph node in the subcarinal space measuring 2.1 cm short axis (series 3, image 39) and a morphologically abnormal low-density lymph node within the RIGHT lower paratracheal space measuring 1.3 cm short axis (series 3, image 28). Esophagus is unremarkable. Trachea appears normal. Lungs/Pleura: Advanced emphysematous changes bilaterally, upper lobe predominant. 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 Spiculated pulmonary nodule within the RIGHT lower lobe measuring 9 mm (series 5, image 95). Prominent parabronchial thickening involving the bronchial segments to the RIGHT lower lobe, likely lymphangitic extension of the perihilar lymph node metastases. Associated narrowing of the RIGHT lower lobe and middle lobe bronchi, without occlusion. Musculoskeletal: Soft tissue mass involving the upper RIGHT scapula, extending into the lower neck, measuring 5.5 x 5.4 cm (series 3, image 8). Additional neoplastic masses involving the posterior RIGHT sixth and seventh ribs, measuring 2.9 cm and 3 cm respectively (series 3, image 29). Additional neoplastic mass involving the posterior RIGHT tenth rib, measuring 4 cm (series 3, image 48). CT ABDOMEN PELVIS FINDINGS Hepatobiliary: No focal liver abnormality is seen. No gallstones, gallbladder wall thickening, or biliary dilatation. Pancreas: Unremarkable. No pancreatic ductal dilatation or surrounding inflammatory changes. Spleen: Normal in size without focal abnormality. Adrenals/Urinary Tract: RIGHT adrenal mass measures 1.7 cm. LEFT adrenal gland is enlarged without discrete mass. Kidneys are unremarkable without mass, stone or hydronephrosis. Bladder appears normal. Stomach/Bowel: No dilated large or  small bowel loops. No evidence of focal bowel wall thickening or bowel wall inflammation. Appendix is normal. Stomach is unremarkable. Vascular/Lymphatic: Aortic atherosclerosis. No acute appearing vascular abnormality. No enlarged lymph nodes appreciated. Mildly prominent lymph nodes within the periaortic retroperitoneum are suspicious for additional metastatic disease. Reproductive: Prostate is unremarkable. Other: No free fluid or abscess collection. No free intraperitoneal air. Musculoskeletal: Neoplastic soft tissue mass involving the RIGHT lateral half of the L4 vertebral body, with probable associated pathologic fractures, the soft tissue component measuring 4.7 x 4.7 cm (series 3, image 87). The mass involves the RIGHT pedicle. No convincing extension of the mass into the central canal. Mass does involve the RIGHT neural foramen with probable associated nerve root impingement. IMPRESSION: 1. Soft tissue mass involving the upper RIGHT scapula, extending into the lower neck, measuring 5.5 x 5.4 cm, consistent with metastatic disease. 2. Additional destructive neoplastic masses involving the posterior RIGHT sixth and seventh ribs, measuring 2.9 cm and 3 cm respectively. 3. Additional destructive neoplastic mass involving the posterior RIGHT tenth rib, measuring 4 cm. 4. Enlarged and morphologically abnormal lymph nodes within the mediastinum and perihilar regions, consistent with metastatic disease. 5. Spiculated pulmonary nodule within the RIGHT lower lobe, measuring 9 mm, also suspicious for metastatic disease. 6. Prominent parabronchial thickening involving the bronchial segments to the RIGHT lower lobe, likely lymphangitic extension of the perihilar lymph node metastases. Associated narrowing of the RIGHT lower lobe and middle lobe bronchi, without occlusion. 7. Mildly prominent lymph nodes within the periaortic retroperitoneum, 1 of which lies posterior to the IVC and is highly suspicious for additional  metastatic disease. 8. RIGHT adrenal mass measuring 1.7 cm, suspicious for adrenal metastasis. 9. Neoplastic soft tissue mass involving the RIGHT lateral half of the L4 vertebral body, with associated destruction of the vertebral body and with probable associated pathologic fractures, measuring approximately 4 cm greatest dimension. This mass involves the proximal portion of the RIGHT pedicle and extends to the RIGHT neural foramen with probable associated nerve root compression. 10. Advanced emphysema. 11. Asymmetric interstitial prominence within the RIGHT upper lobe, suspicious  for lymphangitis carcinomatosis. 12. Diffuse coronary artery calcifications, particularly dense within the LEFT anterior descending coronary artery. Aortic Atherosclerosis (ICD10-I70.0) and Emphysema (ICD10-J43.9). Electronically Signed   By: Franki Cabot M.D.   On: 06/16/2018 23:50   Ct Biopsy  Result Date: 06/19/2018 CLINICAL DATA:  Multiple bone metastases, no known primary EXAM: CT GUIDED CORE BIOPSY OF   RIGHT SCAPULAR LESION ANESTHESIA/SEDATION: Intravenous Fentanyl and Versed were administered as conscious sedation during continuous monitoring of the patient's level of consciousness and physiological / cardiorespiratory status by the radiology RN, with a total moderate sedation time of 10 minutes. PROCEDURE: The procedure risks, benefits, and alternatives were explained to the patient. Questions regarding the procedure were encouraged and answered. The patient understands and consents to the procedure. patient placed in left lateral decubitus positioning. select axial scans through the upper thorax obtained. the exophytic soft tissue mass from the right scapula was identified and an appropriate skin entry site was determined and marked. The operative field was prepped with chlorhexidinein a sterile fashion, and a sterile drape was applied covering the operative field. A sterile gown and sterile gloves were used for the  procedure. Local anesthesia was provided with 1% Lidocaine. Under CT fluoroscopic guidance, a 17 gauge trocar needle was advanced to the margin of the lesion. Once needle tip position was confirmed, coaxial 18-gauge core biopsy samples were obtained, submitted in formalin to surgical pathology. The guide needle was removed. Postprocedure scans show no hemorrhage or other apparent complication. COMPLICATIONS: None immediate FINDINGS: Soft tissue attenuation mass exophytic from the superior aspect of the right scapula was localized. Representative core biopsy samples obtained as above. IMPRESSION: 1. Technically successful CT-guided core biopsy, right scapular mass. Electronically Signed   By: Lucrezia Europe M.D.   On: 06/19/2018 11:27   Dg Chest Port 1 View  Result Date: 06/19/2018 CLINICAL DATA:  Sudden onset of hypoxia and need of oxygen this morning EXAM: PORTABLE CHEST - 1 VIEW COMPARISON:  CT 06/16/2018 FINDINGS: Bilateral interstitial edema or infiltrates. Patchy airspace opacities in the right mid and lower lung. Underlying pulmonary emphysema. Heart size normal. Right hilar fullness consistent with known adenopathy. No effusion.  No pneumothorax. Lytic lesion involving posterior aspect right sixth rib IMPRESSION: 1. Asymmetric interstitial and airspace infiltrates or edema, right greater than left. Electronically Signed   By: Lucrezia Europe M.D.   On: 06/19/2018 12:10     Subjective: Eager to go home  Discharge Exam: Vitals:   06/20/18 0614 06/20/18 1141  BP: 97/82 101/67  Pulse: (!) 123 (!) 115  Resp: 18 16  Temp: 98.8 F (37.1 C) 98.2 F (36.8 C)  SpO2: 92% 92%   Vitals:   06/19/18 1130 06/19/18 2145 06/20/18 0614 06/20/18 1141  BP: (!) 126/97 108/83 97/82 101/67  Pulse: (!) 110 (!) 110 (!) 123 (!) 115  Resp: 18 20 18 16   Temp: 98.3 F (36.8 C) 98.4 F (36.9 C) 98.8 F (37.1 C) 98.2 F (36.8 C)  TempSrc: Oral Oral Oral Oral  SpO2: 98% 90% 92% 92%  Weight:      Height:         General: Pt is alert, awake, not in acute distress Cardiovascular: RRR, S1/S2 +, no rubs, no gallops Respiratory: CTA bilaterally, no wheezing, no rhonchi Abdominal: Soft, NT, ND, bowel sounds + Extremities: no edema, no cyanosis   The results of significant diagnostics from this hospitalization (including imaging, microbiology, ancillary and laboratory) are listed below for reference.     Microbiology: No  results found for this or any previous visit (from the past 240 hour(s)).   Labs: BNP (last 3 results) Recent Labs    06/19/18 1841  BNP 80.3   Basic Metabolic Panel: Recent Labs  Lab 06/16/18 2049 06/16/18 2100 06/17/18 0313 06/18/18 0235 06/20/18 0156  NA 134* 139 136 135 136  K 3.1* 3.2* 3.1* 3.5 3.7  CL 101 101 101 102 97*  CO2 26  --  26 25 27   GLUCOSE 96 90 93 95 102*  BUN 18 21* 15 13 11   CREATININE 0.95 0.80 0.95 0.76 0.91  CALCIUM 9.5  --  9.5 9.5 10.1   Liver Function Tests: Recent Labs  Lab 06/17/18 0710  AST 16  ALT 11  ALKPHOS 93  BILITOT 0.8  PROT 6.9  ALBUMIN 3.8   No results for input(s): LIPASE, AMYLASE in the last 168 hours. No results for input(s): AMMONIA in the last 168 hours. CBC: Recent Labs  Lab 06/16/18 2049 06/16/18 2100 06/17/18 0313 06/18/18 0235  WBC 9.7  --  9.9 9.5  NEUTROABS 6.1  --   --   --   HGB 14.1 13.9 14.5 14.3  HCT 42.6 41.0 42.4 41.6  MCV 94.9  --  92.8 91.6  PLT 285  --  284 288   Cardiac Enzymes: No results for input(s): CKTOTAL, CKMB, CKMBINDEX, TROPONINI in the last 168 hours. BNP: Invalid input(s): POCBNP CBG: No results for input(s): GLUCAP in the last 168 hours. D-Dimer No results for input(s): DDIMER in the last 72 hours. Hgb A1c No results for input(s): HGBA1C in the last 72 hours. Lipid Profile No results for input(s): CHOL, HDL, LDLCALC, TRIG, CHOLHDL, LDLDIRECT in the last 72 hours. Thyroid function studies No results for input(s): TSH, T4TOTAL, T3FREE, THYROIDAB in the last 72  hours.  Invalid input(s): FREET3 Anemia work up No results for input(s): VITAMINB12, FOLATE, FERRITIN, TIBC, IRON, RETICCTPCT in the last 72 hours. Urinalysis No results found for: COLORURINE, APPEARANCEUR, LABSPEC, Dorris, GLUCOSEU, HGBUR, BILIRUBINUR, KETONESUR, PROTEINUR, UROBILINOGEN, NITRITE, LEUKOCYTESUR Sepsis Labs Invalid input(s): PROCALCITONIN,  WBC,  LACTICIDVEN Microbiology No results found for this or any previous visit (from the past 240 hour(s)).  Time spent: 30 min  SIGNED:   Marylu Lund, MD  [facility] 06/20/2018, 5:51 PM  If 7PM-7AM, please contact night-coverage

## 2018-06-21 ENCOUNTER — Ambulatory Visit
Admission: RE | Admit: 2018-06-21 | Discharge: 2018-06-21 | Disposition: A | Discharge status: Home / Self Care | Payer: Managed Care, Other (non HMO) | Source: Ambulatory Visit | Attending: Radiation Oncology | Admitting: Radiation Oncology

## 2018-06-21 ENCOUNTER — Other Ambulatory Visit: Payer: Self-pay | Admitting: Urology

## 2018-06-21 DIAGNOSIS — Z510 Encounter for antineoplastic radiation therapy: Secondary | ICD-10-CM | POA: Diagnosis not present

## 2018-06-21 DIAGNOSIS — C801 Malignant (primary) neoplasm, unspecified: Secondary | ICD-10-CM | POA: Diagnosis not present

## 2018-06-21 DIAGNOSIS — D492 Neoplasm of unspecified behavior of bone, soft tissue, and skin: Secondary | ICD-10-CM

## 2018-06-21 DIAGNOSIS — C799 Secondary malignant neoplasm of unspecified site: Secondary | ICD-10-CM

## 2018-06-21 DIAGNOSIS — C7951 Secondary malignant neoplasm of bone: Secondary | ICD-10-CM | POA: Diagnosis present

## 2018-06-23 ENCOUNTER — Telehealth: Payer: Self-pay | Admitting: *Deleted

## 2018-06-23 NOTE — Telephone Encounter (Signed)
Oncology Nurse Navigator Documentation  Oncology Nurse Navigator Flowsheets 06/23/2018  Navigator Location [facility]  Navigator Encounter Type Telephone/I called Mr. Hammac per Dr. Benay Spice and updated on path and to schedule him an appt with med onc. Patient would like to be seen here.  Dr. Benay Spice states he can see him on 12/3, and I gave patient appt for 12/3 at 3:00.    Telephone Outgoing Call  Barriers/Navigation Needs Coordination of Care  Interventions Coordination of Care  Coordination of Care Appts  Acuity Level 2  Time Spent with Patient 30

## 2018-06-25 DIAGNOSIS — C7951 Secondary malignant neoplasm of bone: Secondary | ICD-10-CM | POA: Insufficient documentation

## 2018-06-26 ENCOUNTER — Ambulatory Visit
Admission: RE | Admit: 2018-06-26 | Discharge: 2018-06-26 | Disposition: A | Discharge status: Home / Self Care | Payer: Managed Care, Other (non HMO) | Source: Ambulatory Visit | Attending: Radiation Oncology | Admitting: Radiation Oncology

## 2018-06-26 ENCOUNTER — Other Ambulatory Visit: Payer: Self-pay | Admitting: Radiation Oncology

## 2018-06-26 ENCOUNTER — Telehealth: Payer: Self-pay | Admitting: Radiation Oncology

## 2018-06-26 VITALS — BP 116/86 | HR 100 | Temp 97.9°F | Resp 20

## 2018-06-26 DIAGNOSIS — C801 Malignant (primary) neoplasm, unspecified: Secondary | ICD-10-CM | POA: Insufficient documentation

## 2018-06-26 DIAGNOSIS — C7951 Secondary malignant neoplasm of bone: Secondary | ICD-10-CM

## 2018-06-26 DIAGNOSIS — Z510 Encounter for antineoplastic radiation therapy: Secondary | ICD-10-CM | POA: Insufficient documentation

## 2018-06-26 MED ORDER — MORPHINE SULFATE 4 MG/ML IJ SOLN
2.0000 mg | INTRAMUSCULAR | Status: AC
  Administered 2018-06-26: 2 mg via INTRAMUSCULAR
  Filled 2018-06-26: qty 1

## 2018-06-26 MED ORDER — DEXAMETHASONE 4 MG PO TABS: 4.0000 mg | ORAL_TABLET | Freq: Two times a day (BID) | ORAL | 0 refills | Status: AC

## 2018-06-26 MED ORDER — OXYCODONE HCL 5 MG PO TABS: 5.0000 mg | ORAL_TABLET | Freq: Four times a day (QID) | ORAL | 0 refills | Status: DC | PRN

## 2018-06-26 MED ORDER — OXYCODONE HCL 5 MG PO TABS: 5.0000 mg | ORAL_TABLET | Freq: Four times a day (QID) | ORAL | 0 refills | Status: AC | PRN

## 2018-06-26 NOTE — Progress Notes (Signed)
Patient came around to the clinic following his radiation treatment with complaints of pain in his neck.  He was given 2 mg Morphine IM prior to his treatment.  Aspen collar was removed for treatment and re-applied.  Once in the clinic the patient was placed on stretcher to alleviate some pressure on his neck.  Collar was readjusted to be in alignment.  Patient states pain is better, assisted to sitting position and then to wheelchair.  No further concerns voiced.  Patient wheeled out to main lobby with family.  Will continue to follow as necessary.  Gloriajean Dell. Leonie Green, BSN

## 2018-06-26 NOTE — Progress Notes (Signed)
Late entry from 06/26/2018 at 1030. Therapist brought patient to nursing clinic after he was unable to endure radiation treatment due to excessive pain. Reports neck pain while standing 8 on a scale of 0-10. Reports pain is tolerable while he is laying flat. Reports feeling intense neck pressure while attempting to receiving cervical spine radiation treatment. Reports taking hydrocodone around 0700 this morning but, nothing else. Reports he hasn't taken his oxycontin yet. Denies taking prednisone or decadron at this time. Accompanied by son, Inocente Salles, who confirms all that was reported by the patient. Reports headache and dizziness while standing but not while laying down. Reports nausea worse with standing. Denies diplopia or tinnitus. Denies numbness or tingling in arms or legs. Will administer 2mg  morphine IM as ordered by Shona Simpson, PA-C.

## 2018-06-26 NOTE — Progress Notes (Signed)
Spoke with [name] at [facility] [place] she will cancel pain script. Patient aware pain script and steroid script should be picked up at [facility] location.

## 2018-06-27 ENCOUNTER — Ambulatory Visit
Admission: RE | Admit: 2018-06-27 | Discharge: 2018-06-27 | Disposition: A | Discharge status: Home / Self Care | Payer: Managed Care, Other (non HMO) | Source: Ambulatory Visit | Attending: Radiation Oncology | Admitting: Radiation Oncology

## 2018-06-27 ENCOUNTER — Inpatient Hospital Stay: Payer: Managed Care, Other (non HMO) | Attending: Oncology | Admitting: Oncology

## 2018-06-27 VITALS — BP 139/95 | HR 115 | Temp 98.2°F | Ht 67.0 in | Wt 124.6 lb

## 2018-06-27 DIAGNOSIS — F17200 Nicotine dependence, unspecified, uncomplicated: Secondary | ICD-10-CM | POA: Diagnosis not present

## 2018-06-27 DIAGNOSIS — C7951 Secondary malignant neoplasm of bone: Secondary | ICD-10-CM | POA: Diagnosis not present

## 2018-06-27 DIAGNOSIS — F1010 Alcohol abuse, uncomplicated: Secondary | ICD-10-CM

## 2018-06-27 DIAGNOSIS — R918 Other nonspecific abnormal finding of lung field: Secondary | ICD-10-CM

## 2018-06-27 DIAGNOSIS — J449 Chronic obstructive pulmonary disease, unspecified: Secondary | ICD-10-CM

## 2018-06-27 DIAGNOSIS — Z807 Family history of other malignant neoplasms of lymphoid, hematopoietic and related tissues: Secondary | ICD-10-CM

## 2018-06-27 DIAGNOSIS — C7971 Secondary malignant neoplasm of right adrenal gland: Secondary | ICD-10-CM

## 2018-06-27 DIAGNOSIS — R634 Abnormal weight loss: Secondary | ICD-10-CM

## 2018-06-27 DIAGNOSIS — Z510 Encounter for antineoplastic radiation therapy: Secondary | ICD-10-CM | POA: Diagnosis not present

## 2018-06-27 DIAGNOSIS — C801 Malignant (primary) neoplasm, unspecified: Secondary | ICD-10-CM

## 2018-06-27 NOTE — Progress Notes (Signed)
[place] New Patient Consult   Requesting MD: Marcelene Butte [address] [place], [place] [address]   Timothy Wade 57 y.o.  [birth year]    Reason for Consult: Metastatic adenocarcinoma   HPI: Timothy Wade reports a history of progressive neck pain over 1 to 2 weeks.  He saw his primary provider and was referred for a CT of the cervical spine on 06/16/2018.  This revealed a soft tissue mass involving the lower aspect of C2 with osseous destruction.  There was mild spinal canal stenosis.  The mass extends into the right neural foramen.  There is mild destruction of the posterior superior corner of C3.  He was referred to the [facility] emergency room.  He was evaluated by Dr. Annette Stable.  A staging work-up was recommended.  There was no indication for urgent surgery. CTs of the chest, abdomen, and pelvis on 06/16/2018 revealed multiple bone metastases with associated soft tissue masses including the right scapula, posterior right sixth and seventh ribs, and right 10th rib.  Enlarged abnormal appearing lymph nodes were noted in the mediastinum and perihilar regions.  A spiculated right lower lobe nodule.  Prominent peribronchial thickening at the right lower lobe with probable lymphatic extension of perihilar tumor.  Prominent periaortic retroperitoneal node.  A right adrenal mass measures 1.7 cm.  There is an L4 metastasis.  Advanced emphysema.  There is suspicion for lymphatic tumor spread in the right upper lobe.  A CT-guided biopsy of the right scapula mass on 06/19/2018 revealed metastatic adenocarcinoma.  Immunohistochemical stains found the tumor cells to be positive for cytokeratin 7 and patchy positive for CDX-2.  The tumor cells are negative for cytokeratin 20, TTF-1, and Napsin A.  The immunohistochemical profile was felt to be suggestive of an upper gastrointestinal or pancreaticobiliary primary.  Dr. Tammi Klippel was consulted.  Palliative radiation to the cervical spine  started on 06/20/2018.  Timothy Wade continues to have neck pain, but has noted partial improvement since starting Decadron yesterday.  Past Medical History:  Diagnosis Date  .  Metastatic adenocarcinoma  November 2019  . Tobacco abuse     .  2 nosebleeds, status post a cauterization procedure in the past  Past Surgical History:  Procedure Laterality Date  . SURGERY SCROTAL / TESTICULAR      .  Lumbar spine surgery in 2014  Medications: Reviewed  Allergies: No Known Allergies  Family history: A brother died of non-Hodgkin's lymphoma at age 59.  No other family history of cancer.  Social History:   He lives with his wife and son in [place].  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  He smokes 2 pack of cigarettes per day.  He reports drinking 6 beers per day.  No transfusion history.  No risk factor for HIV or hepatitis.  ROS:   Positives include: Erect C, 12-14 pound weight loss, nausea, "thick "saliva, solid and liquid dysphagia for the past week, foul-smelling urine, no bowel movement since 06/16/2018, intermittent numbness of the right arm and leg, right periorbital headache, neck pain, cutaneous nodules noted over the past week  A complete ROS was otherwise negative.  Physical Exam:  Blood pressure (!) 139/95, pulse (!) 115, temperature 98.2 F (36.8 C), temperature source Oral, height 5\' 7"  (1.702 m), weight 124 lb 9.6 oz (56.5 kg), SpO2 96 %.  HEENT: Edentulous, oropharynx without visible mass, neck without mass Lungs: Clear bilaterally, no respiratory distress Cardiac: Regular rate and rhythm Abdomen: No hepatospleno megaly, no mass,  nontender GU: Testes without mass Vascular: No leg edema Lymph nodes: Firm 1/2 cm left axillary node, no other cervical, supraclavicular, axillary, or inguinal nodes Neurologic: Alert and oriented, the motor exam appears intact in the upper and lower extremities bilaterally Skin: No rash, multiple 1/2-1 cm firm mobile cutaneous nodules including the  left upper anterior chest, and left upper back Musculoskeletal: No spine tenderness   LAB:  CBC  Lab Results  Component Value Date   WBC 9.5 06/18/2018   HGB 14.3 06/18/2018   HCT 41.6 06/18/2018   MCV 91.6 06/18/2018   PLT 288 06/18/2018   NEUTROABS 6.1 06/16/2018        CMP  Lab Results  Component Value Date   NA 136 06/20/2018   K 3.7 06/20/2018   CL 97 (L) 06/20/2018   CO2 27 06/20/2018   GLUCOSE 102 (H) 06/20/2018   BUN 11 06/20/2018   CREATININE 0.91 06/20/2018   CALCIUM 10.1 06/20/2018   PROT 6.9 06/17/2018   ALBUMIN 3.8 06/17/2018   AST 16 06/17/2018   ALT 11 06/17/2018   ALKPHOS 93 06/17/2018   BILITOT 0.8 06/17/2018   GFRNONAA >60 06/20/2018   GFRAA >60 06/20/2018     Imaging: CTs 06/16/2018- which is reviewed with Timothy Wade and his family     Assessment/Plan:   1. Metastatic adenocarcinoma  CT biopsy of a right scapula mass 06/19/2018-metastatic adenocarcinoma, cytokeratin 7 and patchy CDX-2+, negative for TTF-1, Cytokeratin 20, and Napsin A.  CTs neck, chest, abdomen, and pelvis 22 2019- destructive bone lesions with soft tissue components at C2, right scapula, right posterior ribs, and L4.  Spiculated right lower lobe nodule, abnormal mediastinal and hilar nodes, evidence of lymphangitic tumor spread in the right upper and lower lobes, right adrenal mass, emphysema  Palliative radiation to the destructive C2 mass beginning 06/20/2018  2.   Pain secondary to the destructive C2 mass 3.   COPD with ongoing tobacco use 4.   History of alcohol use 5.   Anorexia/weight loss   Disposition:   Timothy Wade has been diagnosed with metastatic adenocarcinoma involving biopsy of a right scapula mass.  He has multiple destructive bone metastases, a right adrenal mass, and mediastinal/hilar adenopathy.  There is a right lung nodule.  I reviewed the CT images.  The pattern of tumor spread in the setting of tobacco use and COPD is most consistent with lung  cancer.  The differential diagnosis includes an upper gastrointestinal primary and metastatic carcinoma from other primary tumor sites.  He may not be a candidate for an upper endoscopy or bronchoscopy with the destructive C2 mass.  He has seen Dr. Annette Stable.  Dr. Annette Stable can be consulted to consider the indication for endoscopic procedures and surgical stabilization of the spine.  He is completing palliative radiation to the cervical spine.  He will continue a Decadron taper as directed by radiation oncology.  He will continue oxycodone as needed for pain.  I will submit the right scapula biopsy for Foundation 1 testing to include PDL 1 testing.  Timothy Wade lives in [place].  He would like to be treated at the [facility] cancer center.  I will make a referral to Dr. Bobby Rumpf.  I explained no therapy will be curative.  We briefly discussed systemic treatment options which may include immunotherapy and systemic chemotherapy.  He is not scheduled for a follow-up appointment with medical oncology here.  I am available to see him in the future as needed.  Betsy Coder,  MD  06/27/2018, 3:59 PM

## 2018-06-28 ENCOUNTER — Encounter: Payer: Self-pay | Admitting: *Deleted

## 2018-06-28 ENCOUNTER — Ambulatory Visit
Admission: RE | Admit: 2018-06-28 | Discharge: 2018-06-28 | Disposition: A | Discharge status: Home / Self Care | Payer: Managed Care, Other (non HMO) | Source: Ambulatory Visit | Attending: Radiation Oncology | Admitting: Radiation Oncology

## 2018-06-28 DIAGNOSIS — C7951 Secondary malignant neoplasm of bone: Secondary | ICD-10-CM

## 2018-06-28 DIAGNOSIS — Z510 Encounter for antineoplastic radiation therapy: Secondary | ICD-10-CM | POA: Diagnosis not present

## 2018-06-28 NOTE — Progress Notes (Signed)
Called to [place] at [facility] to have Case [phone] sent for Foundation One testing and PDL-1 testing.

## 2018-06-28 NOTE — Progress Notes (Signed)
  Oncology Nurse Navigator Documentation  Navigator Location: [facility] (06/28/18 1402)   )Navigator Encounter Type: Treatment(radiation appointment) (06/28/18 1402)    Spoke with wife down in radiation to let her know referral to medical oncologist at [facility] has been placed. I asked Mrs. Meek to call me if she has not heard from [facility] referral coordinator by 06/30/18 and I will f/u.                         Interventions: Coordination of Care;Psycho-social support (06/28/18 1402)            Acuity: Level 2 (06/28/18 1402)         Time Spent with Patient: 15 (06/28/18 1402)

## 2018-06-28 NOTE — Progress Notes (Signed)
  Oncology Nurse Navigator Documentation  Navigator Location: [facility] (06/27/18 1530) Referral date to RadOnc/MedOnc: 06/23/18 (06/27/18 1530) )Navigator Encounter Type: Initial MedOnc (06/27/18 1530)   Abnormal Finding Date: 06/16/18 (06/27/18 1530) Confirmed Diagnosis Date: 06/19/18 (06/27/18 1530)             Treatment Initiated Date: 06/20/18(Radiation Started) (06/27/18 1530) Patient Visit Type: MedOnc;Initial (06/27/18 1530) Treatment Phase: Pre-Tx/Tx Discussion (06/27/18 1530) Barriers/Navigation Needs: Coordination of Care (06/27/18 1530)   Interventions: Referrals (06/27/18 1530) Referrals: Other (06/27/18 1530) Coordination of Care: (Referral to [facility] Oncologist) (06/27/18 1530)    Support Groups/Services: Friends and Family (06/27/18 1530)   Acuity: Level 2 (06/27/18 1530)    Referral Faxed to Pamala Hurry -intake coordinator at [facility] with Patient: 75 (06/27/18 1530)

## 2018-06-29 ENCOUNTER — Encounter: Payer: Self-pay | Admitting: Oncology

## 2018-06-29 ENCOUNTER — Ambulatory Visit
Admission: RE | Admit: 2018-06-29 | Discharge: 2018-06-29 | Disposition: A | Discharge status: Home / Self Care | Payer: Managed Care, Other (non HMO) | Source: Ambulatory Visit | Attending: Radiation Oncology | Admitting: Radiation Oncology

## 2018-06-29 DIAGNOSIS — Z510 Encounter for antineoplastic radiation therapy: Secondary | ICD-10-CM | POA: Diagnosis not present

## 2018-06-30 ENCOUNTER — Telehealth: Payer: Self-pay | Admitting: *Deleted

## 2018-06-30 ENCOUNTER — Ambulatory Visit: Payer: Managed Care, Other (non HMO)

## 2018-06-30 ENCOUNTER — Ambulatory Visit
Admission: RE | Admit: 2018-06-30 | Discharge: 2018-06-30 | Disposition: A | Discharge status: Home / Self Care | Payer: Managed Care, Other (non HMO) | Source: Ambulatory Visit | Attending: Radiation Oncology | Admitting: Radiation Oncology

## 2018-06-30 DIAGNOSIS — Z510 Encounter for antineoplastic radiation therapy: Secondary | ICD-10-CM | POA: Diagnosis not present

## 2018-06-30 NOTE — Progress Notes (Signed)
  Radiation Oncology         [phone] ________________________________  Name: Timothy Wade MRN: [id number]  Date: 06/28/2018  DOB: Sep 27, 1960  SIMULATION AND TREATMENT PLANNING NOTE    ICD-10-CM   1. 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 patient with lumbar metastasis to L4  NARRATIVE:  The patient was brought to the [facility].  Identity was confirmed.  All relevant records and images related to the planned course of therapy were reviewed.  The patient freely provided informed written consent to proceed with treatment after reviewing the details related to the planned course of therapy. The consent form was witnessed and verified by the simulation staff.  Then, the patient was set-up in a stable reproducible  supine position for radiation therapy.  CT images were obtained.  Surface markings were placed.  The CT images were loaded into the planning software.  Then the target and avoidance structures were contoured including kidneys.  Treatment planning then occurred.  The radiation prescription was entered and confirmed.  Then, I designed and supervised the construction of a total of 3 medically necessary complex treatment devices with VacLoc positioner and 2 MLCs to shield kidneys.  I have requested : 3D Simulation  I have requested a DVH of the following structures: Left Kidney, Right Kidney and target.  PLAN:  The patient will receive 30 Gy in 10 fractions.  ________________________________  Sheral Apley Tammi Klippel, M.D.

## 2018-06-30 NOTE — Telephone Encounter (Signed)
Notified wife of appointment on 07/03/18 at 40 with Dr. Orlene Erm. She was already aware. Also informed her to pick up the letter Dr. Benay Spice dictated for them in regards to his disability on Monday. Will be at front desk.

## 2018-07-03 ENCOUNTER — Ambulatory Visit
Admission: RE | Admit: 2018-07-03 | Discharge: 2018-07-03 | Disposition: A | Discharge status: Home / Self Care | Payer: Managed Care, Other (non HMO) | Source: Ambulatory Visit | Attending: Radiation Oncology | Admitting: Radiation Oncology

## 2018-07-03 DIAGNOSIS — Z510 Encounter for antineoplastic radiation therapy: Secondary | ICD-10-CM | POA: Diagnosis not present

## 2018-07-04 ENCOUNTER — Ambulatory Visit: Payer: Managed Care, Other (non HMO)

## 2018-07-04 ENCOUNTER — Ambulatory Visit
Admission: RE | Admit: 2018-07-04 | Discharge: 2018-07-04 | Disposition: A | Discharge status: Home / Self Care | Payer: Managed Care, Other (non HMO) | Source: Ambulatory Visit | Attending: Radiation Oncology | Admitting: Radiation Oncology

## 2018-07-04 DIAGNOSIS — Z510 Encounter for antineoplastic radiation therapy: Secondary | ICD-10-CM | POA: Diagnosis not present

## 2018-07-05 ENCOUNTER — Ambulatory Visit
Admission: RE | Admit: 2018-07-05 | Discharge: 2018-07-05 | Disposition: A | Discharge status: Home / Self Care | Payer: Managed Care, Other (non HMO) | Source: Ambulatory Visit | Attending: Radiation Oncology | Admitting: Radiation Oncology

## 2018-07-05 ENCOUNTER — Encounter: Payer: Self-pay | Admitting: Radiation Oncology

## 2018-07-05 ENCOUNTER — Ambulatory Visit: Payer: Managed Care, Other (non HMO)

## 2018-07-05 DIAGNOSIS — Z510 Encounter for antineoplastic radiation therapy: Secondary | ICD-10-CM | POA: Diagnosis not present

## 2018-07-06 ENCOUNTER — Ambulatory Visit: Payer: Managed Care, Other (non HMO)

## 2018-07-06 NOTE — Progress Notes (Signed)
  Radiation Oncology         [phone] ________________________________  Name: Timothy Wade MRN: [id number]  Date: 07/05/2018  DOB: 1961-01-16  End of Treatment Note  Diagnosis:   58 y.o. male with C2 metastasis and L4 metastasis from widely metastatic cancer  Indication for treatment:  Palliative       Radiation treatment dates:   06/20/2018 - 07/05/2018  Site/dose:    1. The cervical spine was treated to 30 Gy in 10 fractions of 3 Gy. 2. The patient was planned to receive 30 Gy in 10 fractions to the lumbar spine (L3-L5), but was only treated to 9 Gy in 3 fractions. He will receive the rest of his treatment with Dr. Orlene Erm in [place].  Beams/energy:    1. 3D / 10X, 6X Photon 2. 3D / 15X Photon  Narrative: The patient tolerated radiation treatment relatively well.   He developed dry mouth over the course of treatment. He reports continued neck pain and headaches. He confirms he is taking Decadron 4mg  BID. He also notes poor appetite and constipation.  Plan: The patient has completed radiation treatment. Decadron taper given. The patient's care has been transferred to [facility]. The patient will follow up in our clinic as needed. I advised him to call or return sooner if he has any questions or concerns related to his recovery or treatment. ________________________________  Sheral Apley. Tammi Klippel, M.D.  This document serves as a record of services personally performed by Tyler Pita, MD. It was created on his behalf by Rae Lips, a trained medical scribe. The creation of this record is based on the scribe's personal observations and the provider's statements to them. This document has been checked and approved by the attending provider.

## 2018-07-07 ENCOUNTER — Ambulatory Visit: Payer: Managed Care, Other (non HMO)

## 2018-07-07 DIAGNOSIS — Z0001 Encounter for general adult medical examination with abnormal findings: Secondary | ICD-10-CM

## 2018-07-10 ENCOUNTER — Ambulatory Visit: Payer: Managed Care, Other (non HMO)

## 2018-07-11 ENCOUNTER — Ambulatory Visit: Payer: Managed Care, Other (non HMO)

## 2018-07-12 ENCOUNTER — Ambulatory Visit: Payer: Managed Care, Other (non HMO)

## 2018-07-12 DIAGNOSIS — C7971 Secondary malignant neoplasm of right adrenal gland: Secondary | ICD-10-CM

## 2018-07-12 DIAGNOSIS — C7951 Secondary malignant neoplasm of bone: Secondary | ICD-10-CM

## 2018-07-12 DIAGNOSIS — R51 Headache: Secondary | ICD-10-CM

## 2018-07-12 DIAGNOSIS — C801 Malignant (primary) neoplasm, unspecified: Secondary | ICD-10-CM

## 2018-07-13 ENCOUNTER — Ambulatory Visit: Payer: Managed Care, Other (non HMO)

## 2018-07-14 ENCOUNTER — Ambulatory Visit: Payer: Managed Care, Other (non HMO)

## 2018-07-18 ENCOUNTER — Other Ambulatory Visit: Payer: Self-pay | Admitting: Radiation Therapy

## 2018-07-18 ENCOUNTER — Encounter (HOSPITAL_COMMUNITY): Payer: Self-pay | Admitting: Oncology

## 2018-07-18 DIAGNOSIS — C7931 Secondary malignant neoplasm of brain: Secondary | ICD-10-CM

## 2018-07-18 DIAGNOSIS — C7949 Secondary malignant neoplasm of other parts of nervous system: Principal | ICD-10-CM

## 2018-07-21 NOTE — Progress Notes (Signed)
Faxed results to Dr. Keturah Barre. [place] at [facility] as requested.

## 2018-07-29 ENCOUNTER — Ambulatory Visit
Admission: RE | Admit: 2018-07-29 | Discharge: 2018-07-29 | Disposition: A | Discharge status: Home / Self Care | Payer: Managed Care, Other (non HMO) | Source: Ambulatory Visit | Attending: Radiation Oncology | Admitting: Radiation Oncology

## 2018-07-29 DIAGNOSIS — C7949 Secondary malignant neoplasm of other parts of nervous system: Principal | ICD-10-CM

## 2018-07-29 DIAGNOSIS — C7931 Secondary malignant neoplasm of brain: Secondary | ICD-10-CM

## 2018-07-29 MED ORDER — GADOBENATE DIMEGLUMINE 529 MG/ML IV SOLN
9.0000 mL | Freq: Once | INTRAVENOUS | Status: AC | PRN
  Administered 2018-07-29: 9 mL via INTRAVENOUS

## 2018-07-31 DIAGNOSIS — C7931 Secondary malignant neoplasm of brain: Secondary | ICD-10-CM | POA: Insufficient documentation

## 2018-07-31 NOTE — Progress Notes (Signed)
Has armband been applied?  Yes.    Does patient have an allergy to IV contrast dye?: No.   Has patient ever received premedication for IV contrast dye?: No.   Does patient take metformin?: No.  If patient does take metformin when was the last dose: n/a  Date of lab work: 06/20/2018 BUN: 11 CR: 0.91  IV site: antecubital right, condition no redness  Has IV site been added to flowsheet?  Yes.

## 2018-08-01 ENCOUNTER — Encounter: Payer: Self-pay | Admitting: Urology

## 2018-08-01 ENCOUNTER — Ambulatory Visit
Admission: RE | Admit: 2018-08-01 | Discharge: 2018-08-01 | Disposition: A | Discharge status: Home / Self Care | Payer: Managed Care, Other (non HMO) | Source: Ambulatory Visit | Attending: Urology | Admitting: Urology

## 2018-08-01 ENCOUNTER — Other Ambulatory Visit: Payer: Self-pay

## 2018-08-01 ENCOUNTER — Ambulatory Visit
Admission: RE | Admit: 2018-08-01 | Discharge: 2018-08-01 | Disposition: A | Discharge status: Home / Self Care | Payer: Managed Care, Other (non HMO) | Source: Ambulatory Visit | Attending: Radiation Oncology | Admitting: Radiation Oncology

## 2018-08-01 VITALS — BP 138/74 | HR 101 | Temp 98.6°F | Resp 20 | Ht 64.0 in | Wt 127.6 lb

## 2018-08-01 DIAGNOSIS — C7951 Secondary malignant neoplasm of bone: Secondary | ICD-10-CM | POA: Insufficient documentation

## 2018-08-01 DIAGNOSIS — Z923 Personal history of irradiation: Secondary | ICD-10-CM | POA: Insufficient documentation

## 2018-08-01 DIAGNOSIS — C7931 Secondary malignant neoplasm of brain: Secondary | ICD-10-CM | POA: Diagnosis not present

## 2018-08-01 DIAGNOSIS — C3490 Malignant neoplasm of unspecified part of unspecified bronchus or lung: Secondary | ICD-10-CM | POA: Insufficient documentation

## 2018-08-01 DIAGNOSIS — Z79899 Other long term (current) drug therapy: Secondary | ICD-10-CM | POA: Diagnosis not present

## 2018-08-01 DIAGNOSIS — F17210 Nicotine dependence, cigarettes, uncomplicated: Secondary | ICD-10-CM | POA: Insufficient documentation

## 2018-08-01 DIAGNOSIS — Z510 Encounter for antineoplastic radiation therapy: Secondary | ICD-10-CM | POA: Insufficient documentation

## 2018-08-01 DIAGNOSIS — C801 Malignant (primary) neoplasm, unspecified: Secondary | ICD-10-CM | POA: Insufficient documentation

## 2018-08-01 DIAGNOSIS — C7949 Secondary malignant neoplasm of other parts of nervous system: Principal | ICD-10-CM

## 2018-08-01 HISTORY — DX: Malignant (primary) neoplasm, unspecified: C80.1

## 2018-08-01 NOTE — Progress Notes (Signed)
See progress note under physician encounter. 

## 2018-08-01 NOTE — Progress Notes (Signed)
Radiation Oncology         [phone] ________________________________  Initial Outpatient Consultation  Name: Timothy Wade MRN: [id number]  Date of Service: 08/01/2018 DOB: [birth year]  XB:JYNW, [place], NP  Earnie Larsson, MD   REFERRING PHYSICIAN: Elodia Florence, MD  DIAGNOSIS: 58 y.o. 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 Brain metastases ([place]) C79.31     HISTORY OF PRESENT ILLNESS: Timothy Wade is a 58 y.o. male seen at the request of Dr. Elodia Florence for new brain metastases. He recently received radiotherapy to his cervical and lumbar spine, completed 07/14/2018. He started having right-sided headaches a few weeks prior and subsequently underwent brain MRI on 07/17/2018 which was positive for early metastatic disease to the brain. Three small brain metastases ranging from punctate to 4 mm were identified in the left cerebellum and left frontal lobe motor strip with no associated edema or mass effect. There was also one larger but indeterminate nodular area of soft tissue enhancement along the left anterior pineal; attention directed on follow-up. Additionally, the known destructive C2 vertebral metastasis was re-demonstrated. There was also a small right parietal bone metastasis measuring 5 mm. He had significant anorexia, generalized malaise/fatigue and weight loss while tapering off the Decadron and was therefore restarted on 4mg  po BID for the past week with significant improvement.  He has also recently been started on Methadone in combination with Oxycontin for breakthrough pain, for pain management and notes significant improvement in his overall pain tolerability. His pain management is being handled through the cancer center at [facility] in [place].  Follow-up brain MRI on 07/29/2018 demonstrated six brain metastases: 1. Punctate enhancing metastasis within the left cerebellum. 2. 3 mm enhancing metastasis  superficially within the left cerebellum. 3. Punctate enhancing metastasis within the left posterior frontal lobe. 4. Possible punctate cortical metastasis in the left posterior frontal lobe. 5. 3 mm enhancing metastasis along the surface of the precentral gyrus. 6. Punctate metastasis along the posterior surface of the post central gyrus. The somewhat nodular enhancement along the left side of the pineal gland was again noted and felt more likely to represent pineal tissue rather than metastasis. The C2 and right parietal metastases were also re-demonstrated.   The patient reviewed these results with Dr. Bobby Rumpf and has kindly been referred back today for discussion of potential stereotactic radiation treatment options. He confirms that he has not received any chemotherapy or immunotherapy to date due to the timing of new findings of brain disease.  PREVIOUS RADIATION THERAPY: Yes: 06/20/2018 - 07/05/2018:  1. The cervical spine was treated to 30 Gy in 10 fractions of 3 Gy. 2. The patient was planned to receive 30 Gy in 10 fractions to the lumbar spine (L3-L5), but was only treated to 9 Gy in 3 fractions in [place] and completed the remainder of his treatment with Dr. Orlene Erm in [place]- completed 07-14-18.  PAST MEDICAL HISTORY:  Past Medical History:  Diagnosis Date  . Cancer ([place])   . Low magnesium level   . Tobacco abuse       PAST SURGICAL HISTORY: Past Surgical History:  Procedure Laterality Date  . SURGERY SCROTAL / TESTICULAR      FAMILY HISTORY:  Family History  Problem Relation Age of Onset  . Lymphoma Brother     SOCIAL HISTORY:  Social History   Socioeconomic History  . Marital status: Married    Spouse name: Not on file  .  Number of children: Not on file  . Years of education: Not on file  . Highest education level: Not on file  Occupational History  . Not on file  Social Needs  . Financial resource strain: Not on file  . Food insecurity:    Worry: Not  on file    Inability: Not on file  . Transportation needs:    Medical: Not on file    Non-medical: Not on file  Tobacco Use  . Smoking status: Current Every Day Smoker    Packs/day: 2.00    Years: 30.00    Pack years: 60.00  . Smokeless tobacco: Never Used  Substance and Sexual Activity  . Alcohol use: No  . Drug use: No  . Sexual activity: Not Currently  Lifestyle  . Physical activity:    Days per week: Not on file    Minutes per session: Not on file  . Stress: Not on file  Relationships  . Social connections:    Talks on phone: Not on file    Gets together: Not on file    Attends religious service: Not on file    Active member of club or organization: Not on file    Attends meetings of clubs or organizations: Not on file    Relationship status: Not on file  . Intimate partner violence:    Fear of current or ex partner: Not on file    Emotionally abused: Not on file    Physically abused: Not on file    Forced sexual activity: Not on file  Other Topics Concern  . Not on file  Social History Narrative  . Not on file    ALLERGIES: Patient has no known allergies.  MEDICATIONS:  Current Outpatient Medications  Medication Sig Dispense Refill  . albuterol (PROVENTIL HFA;VENTOLIN HFA) 108 (90 Base) MCG/ACT inhaler Inhale 2 puffs into the lungs every 6 (six) hours as needed for wheezing or shortness of breath. 1 Inhaler 0  . cyclobenzaprine (FLEXERIL) 10 MG tablet Take 10 mg by mouth every 12 (twelve) hours as needed for muscle spasms.    Marland Kitchen dexamethasone (DECADRON) 4 MG tablet Take 1 tablet (4 mg total) by mouth 2 (two) times daily. 60 tablet 0  . magnesium oxide (MAG-OX) 400 MG tablet Take 400 mg by mouth daily.    . naproxen (NAPROSYN) 500 MG tablet Take 500 mg by mouth every 12 (twelve) hours as needed for mild pain.    Marland Kitchen oxyCODONE (OXY IR/ROXICODONE) 5 MG immediate release tablet Take 1-2 tablets (5-10 mg total) by mouth every 6 (six) hours as needed for severe pain. 60  tablet 0  . senna (SENOKOT) 8.6 MG TABS tablet Take 1 tablet (8.6 mg total) by mouth daily. 30 each 0   No current facility-administered medications for this encounter.     REVIEW OF SYSTEMS:  On review of systems, the patient reports that he is doing okay overall. He denies any chest pain, shortness of breath, cough, fevers, chills, or night sweats. He reports 10-pound unintended weight loss and dehydration due to poor appetite from Decadron taper to 1/2 tablet BID; this resolved after restarting 4mg  Decadron BID, and he has gained back 2 pounds in the past week with the help of nutritional supplements. He denies any bowel or bladder disturbances, and denies abdominal pain, or vomiting. He is using a daily stool softener and Flomax to help keep his bowels and bladder regulated.  He reports nausea. He denies any new musculoskeletal or  joint aches or pains. He reports continued posterior neck pain which is improved s/p completion of XRT but not resolved; laying down improves the pain. He states that the pain is well-managed with 10mg  methadone BID. He reports continued right-sided headaches x4 weeks. Patient's wife reports he has confusion. A complete review of systems is obtained and is otherwise negative.    PHYSICAL EXAM:  Wt Readings from Last 3 Encounters:  08/01/18 127 lb 9.6 oz (57.9 kg)  08/01/18 106 lb 12.8 oz (48.4 kg)  06/27/18 124 lb 9.6 oz (56.5 kg)   Temp Readings from Last 3 Encounters:  08/04/18 97.8 F (36.6 C) (Oral)  08/01/18 98.6 F (37 C) (Oral)  08/01/18 97.7 F (36.5 C) (Oral)   BP Readings from Last 3 Encounters:  08/04/18 108/76  08/01/18 138/74  08/01/18 116/84   Pulse Readings from Last 3 Encounters:  08/04/18 86  08/01/18 (!) 101  08/01/18 (!) 122    /10  In general this is a cachectic appearing caucasian male in no acute distress. He is alert and oriented x4 and appropriate throughout the examination. HEENT reveals that the patient is normocephalic,  atraumatic. EOMs are intact. PERRLA. Skin is intact without any evidence of gross lesions. Cardiovascular exam reveals a regular rate and rhythm, no clicks rubs or murmurs are auscultated. Chest is clear to auscultation bilaterally. Lymphatic assessment is performed and does not reveal any adenopathy in the cervical, supraclavicular, axillary, or inguinal chains. Abdomen has active bowel sounds in all quadrants and is intact. The abdomen is soft, non tender, non distended. Lower extremities are negative for pretibial pitting edema, deep calf tenderness, cyanosis or clubbing.   KPS = 60  100 - Normal; no complaints; no evidence of disease. 90   - Able to carry on normal activity; minor signs or symptoms of disease. 80   - Normal activity with effort; some signs or symptoms of disease. 78   - Cares for self; unable to carry on normal activity or to do active work. 60   - Requires occasional assistance, but is able to care for most of his personal needs. 50   - Requires considerable assistance and frequent medical care. 82   - Disabled; requires special care and assistance. 38   - Severely disabled; hospital admission is indicated although death not imminent. 3   - Very sick; hospital admission necessary; active supportive treatment necessary. 10   - Moribund; fatal processes progressing rapidly. 0     - Dead  Karnofsky DA, Abelmann [place], Craver LS and Burchenal [facility] [phone]) The use of the nitrogen mustards in the palliative treatment of carcinoma: with particular reference to bronchogenic carcinoma Cancer 1 634-56  LABORATORY DATA:  Lab Results  Component Value Date   WBC 9.5 06/18/2018   HGB 14.3 06/18/2018   HCT 41.6 06/18/2018   MCV 91.6 06/18/2018   PLT 288 06/18/2018   Lab Results  Component Value Date   NA 136 06/20/2018   K 3.7 06/20/2018   CL 97 (L) 06/20/2018   CO2 27 06/20/2018   Lab Results  Component Value Date   ALT 11 06/17/2018   AST 16 06/17/2018   ALKPHOS 93  06/17/2018   BILITOT 0.8 06/17/2018     RADIOGRAPHY: Mr Jeri Cos NU Contrast  Result Date: 07/29/2018 CLINICAL DATA:  Follow-up metastatic lesions to the brain. Primary lung cancer. EXAM: MRI HEAD WITHOUT AND WITH CONTRAST TECHNIQUE: Multiplanar, multiecho pulse sequences of the brain and surrounding structures were obtained  without and with intravenous contrast. CONTRAST:  36mL MULTIHANCE GADOBENATE DIMEGLUMINE 529 MG/ML IV SOLN COMPARISON:  07/17/2018 FINDINGS: Brain: Previously seen acute infarction in the posterior cerebellum on the right no longer shows restricted diffusion. There are 2 newly seen punctate foci of restricted diffusion along cortical surface of 2 gyri at the left frontoparietal vertex. These do not show contrast enhancement and probably represent 2 micro embolic infarctions in that location. There are mild chronic small-vessel changes of the pons. There are old small vessel infarctions within the basal ganglia and hemispheric white matter. No large vessel territory infarction is seen. Punctate enhancing metastasis within the left cerebellum axial image 48. 3 mm enhancing metastasis superficially within the left cerebellum axial image 50. Punctate enhancing metastasis within the left posterior frontal lobe axial image 91. Possible punctate cortical metastasis in the left posterior frontal lobe axial image 115. 3 mm enhancing metastasis along the surface of the precentral gyrus axial image 130. Punctate metastasis along the posterior surface of the post central gyrus axial image 129. Somewhat nodular enhancement along the left side of the pineal gland is again noted and felt more likely to represent pineal tissue rather than a metastasis. 5 mm right parietal bone metastasis as seen previously. Extensive destructive metastasis of the C2 vertebral body as seen previously, encroaching upon the spinal canal but not compressing the spinal cord. Vascular: Major vessels at the base of the brain show  flow. Skull and upper cervical spine: See above for metastatic lesions of C2 and the right parietal bone. Sinuses/Orbits: Sinuses are clear. Orbits negative. Small mastoid effusions. Other: None IMPRESSION: Six brain metastases demonstrated today as described above. C2 metastasis and right parietal calvarial metastasis as previously seen. 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 Two punctate infarctions demonstrated today affecting the gyri of the frontoparietal vertex consistent with micro embolic infarctions. Electronically Signed   By: Nelson Chimes M.D.   On: 07/29/2018 11:45      IMPRESSION/PLAN: 1. 58 y.o. 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 At this point, the patient would potentially benefit from radiotherapy. The options include whole brain irradiation versus stereotactic radiosurgery. There are pros and cons associated with each of these potential treatment options. Whole brain radiotherapy would treat the known metastatic deposits and help provide some reduction of risk for future brain metastases. However, whole brain radiotherapy carries potential risks including hair loss, subacute somnolence, and neurocognitive changes including a possible reduction in short-term memory. Whole brain radiotherapy also may carry a lower likelihood of tumor control at the treatment sites because of the low-dose used. Stereotactic radiosurgery carries a higher likelihood for local tumor control at the targeted sites with lower associated risk for neurocognitive changes such as memory loss. However, the use of stereotactic radiosurgery in this setting may leave the patient at increased risk for new brain metastases elsewhere in the brain as high as 50-60%. Accordingly, patients who receive stereotactic radiosurgery in this setting should undergo ongoing surveillance imaging with brain MRI more frequently in order to identify and treat new small brain metastases  before they become symptomatic. Stereotactic radiosurgery does carry some different risks, including a risk of radionecrosis.  PLAN: Today, we reviewed the findings and workup thus far with the patient and his family. We discussed the dilemma regarding whole brain radiotherapy versus stereotactic radiosurgery. We discussed the pros and cons of each. We also discussed the logistics and delivery of each. We reviewed the results associated with each of the  treatments described above. The patient seems to understand the treatment options and would like to proceed with stereotactic radiosurgery.  We anticpate a single fraction of SRS treatment to the 6 small brain metastases documented on recent [place] 3T MRI brain from 07/29/18.  He has freely signed written consent to proceed today in the office and a copy of this document has been placed in his chart. He will undergo CT SIM/treatment planning today in anticipation of SRS treatment, coordinated with Dr. Annette Stable, on Friday 08/04/18.  We will share this information with his treatment team at [facility] center to keep everyone in the loop regarding his treatment.  We spent 60 minutes minutes face to face with the patient and more than 50% of that time was spent in counseling and/or coordination of care.    Nicholos Johns, PA-C    Tyler Pita, MD  [facility] Direct Dial: [phone]  Fax: [phone] [email or web address]  Skype  [email or web address]   This document serves as a record of services personally performed by Tyler Pita, MD and Freeman Caldron, PA-C. It was created on their behalf by Rae Lips, a trained medical scribe. The creation of this record is based on the scribe's personal observations and the providers' statements to them. This document has been checked and approved by the attending providers.

## 2018-08-03 ENCOUNTER — Encounter: Payer: Self-pay | Admitting: General Practice

## 2018-08-03 DIAGNOSIS — Z510 Encounter for antineoplastic radiation therapy: Secondary | ICD-10-CM | POA: Diagnosis not present

## 2018-08-03 NOTE — Progress Notes (Signed)
[place] Psychosocial Distress Screening Clinical Social Work  Clinical Social Work was referred by distress screening protocol.  The patient scored a 8 on the Psychosocial Distress Thermometer which indicates moderate distress. Clinical Social Worker contacted patient by phone to assess for distress and other psychosocial needs. Spoke w patient who suggested I speak w wife - wife states that "today has been a good day, he's able to eat and drink and feels pretty good."  Reports they have strong support from friends and no current needs.  Their situation is complex because wife is also chronically ill, receives dialysis three time/week.  "We pretty much take care of each other."  Briefly reviewed services of [place] and encouraged family to call for help as needed.  Are currently receiving most of their care in [facility] and will pursue services there.    ONCBCN DISTRESS SCREENING 08/02/2018  Screening Type Change in Status  Distress experienced in past week (1-10) 8  Emotional problem type Depression;Adjusting to illness;Adjusting to appearance changes  Spiritual/Religous concerns type Facing my mortality  Information Concerns Type Lack of info about diagnosis;Lack of info about treatment  Physical Problem type Pain;Sleep/insomnia;Bathing/dressing;Breathing;Loss of appetitie;Constipation/diarrhea  Physician notified of physical symptoms Yes  Referral to clinical psychology No  Referral to clinical social work Yes  Referral to dietition No  Referral to financial advocate No  Referral to support programs No  Referral to palliative care No  Other phone    Clinical Social Worker follow up needed: No.  If yes, follow up plan:  Beverely Pace, [place], LCSW Clinical Social Worker Phone:  [phone]

## 2018-08-04 ENCOUNTER — Ambulatory Visit
Admission: RE | Admit: 2018-08-04 | Discharge: 2018-08-04 | Disposition: A | Discharge status: Home / Self Care | Payer: Managed Care, Other (non HMO) | Source: Ambulatory Visit | Attending: Radiation Oncology | Admitting: Radiation Oncology

## 2018-08-04 VITALS — BP 108/76 | HR 86 | Temp 97.8°F | Resp 20

## 2018-08-04 DIAGNOSIS — C7931 Secondary malignant neoplasm of brain: Secondary | ICD-10-CM

## 2018-08-04 DIAGNOSIS — Z510 Encounter for antineoplastic radiation therapy: Secondary | ICD-10-CM | POA: Diagnosis not present

## 2018-08-04 NOTE — Progress Notes (Signed)
  Name: Timothy Wade  MRN: [id number]  Date: 08/04/2018   DOB: 1960/11/29  Stereotactic Radiosurgery Operative Note  PRE-OPERATIVE DIAGNOSIS:  Multiple Brain Metastases  POST-OPERATIVE DIAGNOSIS:  Multiple Brain Metastases  PROCEDURE:  Stereotactic Radiosurgery  SURGEON:  Charlie Pitter, MD  NARRATIVE: The patient underwent a radiation treatment planning session in the radiation oncology simulation suite under the care of the radiation oncology physician and physicist.  I participated closely in the radiation treatment planning afterwards. The patient underwent planning CT which was fused to 3T high resolution MRI with 1 mm axial slices.  These images were fused on the planning system.  We contoured the gross target volumes and subsequently expanded this to yield the Planning Target Volume. I actively participated in the planning process.  I helped to define and review the target contours and also the contours of the optic pathway, eyes, brainstem and selected nearby organs at risk.  All the dose constraints for critical structures were reviewed and compared to AAPM Task Group 101.  The prescription dose conformity was reviewed.  I approved the plan electronically.    Accordingly, Timothy Wade was brought to the TrueBeam stereotactic radiation treatment linac and placed in the custom immobilization mask.  The patient was aligned according to the IR fiducial markers with BrainLab Exactrac, then orthogonal x-rays were used in ExacTrac with the 6DOF robotic table and the shifts were made to align the patient  Timothy Wade received stereotactic radiosurgery uneventfully.    Lesions treated:  6   Complex lesions treated:  0 (>3.5 cm, <40mm of optic path, or within the brainstem)   The detailed description of the procedure is recorded in the radiation oncology procedure note.  I was present for the duration of the procedure.  DISPOSITION:  Following delivery, the patient was transported to nursing in  stable condition and monitored for possible acute effects to be discharged to home in stable condition with follow-up in one month.  Charlie Pitter, MD 08/04/2018 4:41 PM

## 2018-08-04 NOTE — Progress Notes (Signed)
  Radiation Oncology         [phone] ________________________________  Stereotactic Treatment Procedure Note  Name: Timothy Wade MRN: [id number]  Date: 08/04/2018  DOB: 12-17-1960  SPECIAL TREATMENT PROCEDURE    ICD-10-CM   1. Brain metastases ([place]) C79.31     3D TREATMENT PLANNING AND DOSIMETRY:  The patient's radiation plan was reviewed and approved by neurosurgery and radiation oncology prior to treatment.  It showed 3-dimensional radiation distributions overlaid onto the planning CT/MRI image set.  The [facility] for the target structures as well as the organs at risk were reviewed. The documentation of the 3D plan and dosimetry are filed in the radiation oncology EMR.  NARRATIVE:  Timothy Wade was brought to the TrueBeam stereotactic radiation treatment machine and placed supine on the CT couch. The head frame was applied, and the patient was set up for stereotactic radiosurgery.  Neurosurgery was present for the set-up and delivery  SIMULATION VERIFICATION:  In the couch zero-angle position, the patient underwent Exactrac imaging using the Brainlab system with orthogonal KV images.  These were carefully aligned and repeated to confirm treatment position for each of the isocenters.  The Exactrac snap film verification was repeated at each couch angle.  PROCEDURE: Timothy Wade received stereotactic radiosurgery to the following targets: Six targets:  PTV1 Lt Cerebellum 78mm 20Gy PTV2 Lt Cerebellum 75mm 20Gy PTV3 Lt Frontal 52mm 20Gy PTV4 Lt Frontal 80mm 20Gy PTV5 Lt Gyrus 77mm 20 Gy PTV6 Lt Post Gyrus 72mm 20Gy  were treated using 5 Rapid Arc VMAT Beams to a prescription dose of 20 Gy.  ExacTrac registration was performed for each couch angle.  The 100% isodose line was prescribed.  6 MV X-rays were delivered in the flattening filter free beam mode.  STEREOTACTIC TREATMENT MANAGEMENT:  Following delivery, the patient was transported to nursing in stable condition and monitored for  possible acute effects.  Vital signs were recorded BP 108/76 (BP Location: Right Arm, Patient Position: Sitting)   Pulse 86   Temp 97.8 F (36.6 C) (Oral)   Resp 20   SpO2 99% . The patient tolerated treatment without significant acute effects, and was discharged to home in stable condition.    PLAN: Follow-up in one month.  ________________________________  Sheral Apley. Tammi Klippel, M.D.   This document serves as a record of services personally performed by Tyler Pita, MD. It was created on his behalf by Wilburn Mylar, a trained medical scribe. The creation of this record is based on the scribe's personal observations and the provider's statements to them. This document has been checked and approved by the attending provider.

## 2018-08-05 NOTE — Progress Notes (Signed)
  Radiation Oncology         [phone] ________________________________  Name: Timothy Wade MRN: [id number]  Date: 08/01/2018  DOB: 09/03/1960  SIMULATION AND TREATMENT PLANNING NOTE    ICD-10-CM   1. 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  Identity was confirmed.  All relevant records and images related to the planned course of therapy were reviewed.  The patient freely provided informed written consent to proceed with treatment after reviewing the details related to the planned course of therapy. The consent form was witnessed and verified by the simulation staff. Intravenous access was established for contrast administration. Then, the patient was set-up in a stable reproducible supine position for radiation therapy.  A relocatable thermoplastic stereotactic head frame was fabricated for precise immobilization.  CT images were obtained.  Surface markings were placed.  The CT images were loaded into the planning software and fused with the patient's targeting MRI scan.  Then the target and avoidance structures were contoured.  Treatment planning then occurred.  The radiation prescription was entered and confirmed.  I have requested 3D planning  I have requested a DVH of the following structures: Brain stem, brain, left eye, right eye, lenses, optic chiasm, target volumes, uninvolved brain, and normal tissue.    SPECIAL TREATMENT PROCEDURE:  The planned course of therapy using radiation constitutes a special treatment procedure. Special care is required in the management of this patient for the following reasons. This treatment constitutes a Special Treatment Procedure for the following reason: High dose per fraction requiring special monitoring for increased toxicities of treatment including daily imaging.  The special nature of the planned course of radiotherapy  will require increased physician supervision and oversight to ensure patient's safety with optimal treatment outcomes.  PLAN:  The patient will receive 20 Gy in 1 fraction.  ________________________________  Sheral Apley Tammi Klippel, M.D.

## 2018-08-11 ENCOUNTER — Encounter: Payer: Self-pay | Admitting: Radiation Oncology

## 2018-08-11 DIAGNOSIS — C3490 Malignant neoplasm of unspecified part of unspecified bronchus or lung: Secondary | ICD-10-CM

## 2018-08-11 DIAGNOSIS — C7951 Secondary malignant neoplasm of bone: Secondary | ICD-10-CM | POA: Diagnosis not present

## 2018-08-11 NOTE — Progress Notes (Signed)
  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 No significant acute effects noted.  He was discharged home in stable condition.  Plan: The patient has completed radiation treatment. The patient will return to radiation oncology clinic for routine followup in one month. I advised him to call or return sooner if he has any questions or concerns related to his recovery or treatment. ________________________________  Sheral Apley. Tammi Klippel, M.D.   This document serves as a record of services personally performed by Tyler Pita, MD. It was created on his behalf by Wilburn Mylar, a trained medical scribe. The creation of this record is based on the scribe's personal observations and the provider's statements to them. This document has been checked and approved by the attending provider.

## 2018-09-05 ENCOUNTER — Telehealth: Payer: Self-pay | Admitting: Radiation Therapy

## 2018-09-05 NOTE — Telephone Encounter (Signed)
Wife called to cancel 1 month follow-up stating that Asaph passed away on September 14, 2022.    Mont Dutton R.T.(R)(T)

## 2018-09-07 ENCOUNTER — Ambulatory Visit: Payer: Managed Care, Other (non HMO) | Admitting: Urology

## 2018-09-24 DEATH — deceased

## 2020-11-10 IMAGING — CT CT CERVICAL SPINE W/O CM
3 of 4 series · 13 of 33 positions shown, 16 images · non-contrast
Comparison: Cervical spine MRI 06/16/2018

CLINICAL DATA: Neck pain months.  Right-sided numbness and tingling

EXAM:
CT CERVICAL SPINE WITHOUT CONTRAST
TECHNIQUE: Multidetector CT imaging of the cervical spine was performed without
intravenous contrast. Multiplanar CT image reconstructions were also
generated.

[Series 6: sag bone · sagittal · 0.30mm/px · 5 of 61 slices shown, 6 images]
[im 21/61  bone]
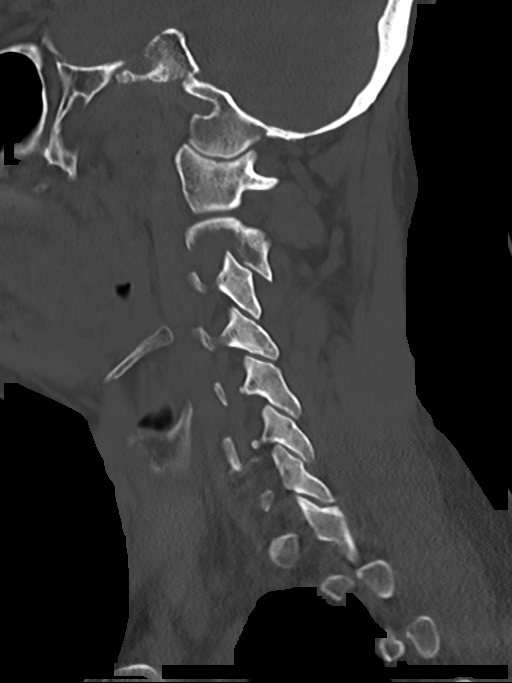
[im 26/61  bone]
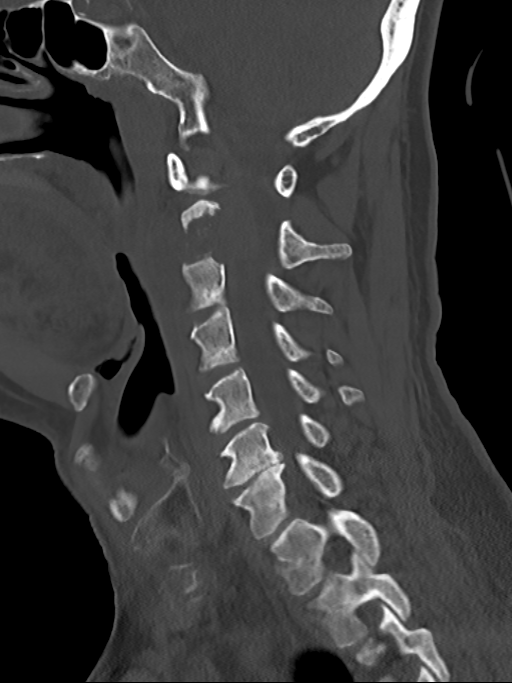
[im 31/61  soft-tissue]
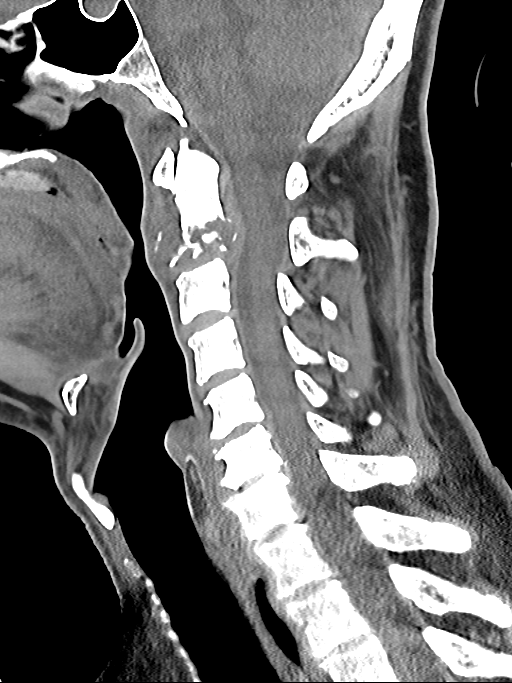
[im 31/61  bone]
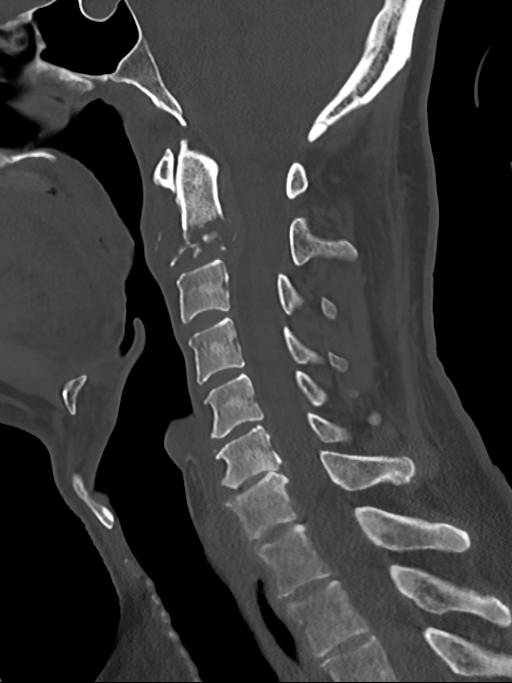
[im 36/61  bone]
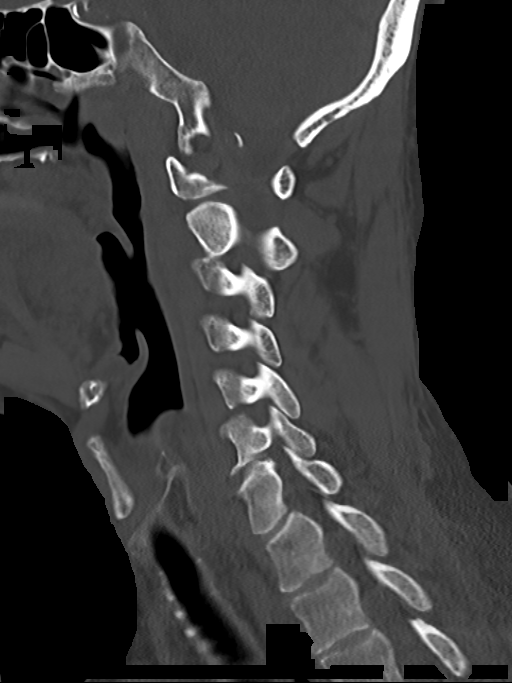
[im 41/61  bone]
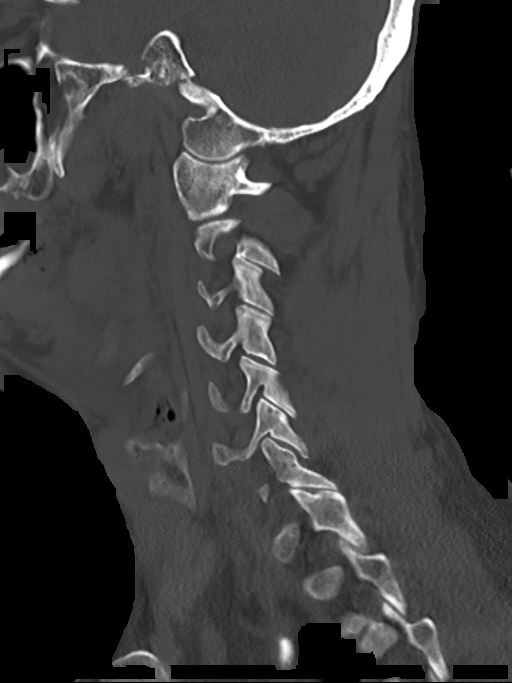

[Series 7: cor bone · coronal · 0.30mm/px · 3 of 61 slices shown]
[im 13/61  bone]
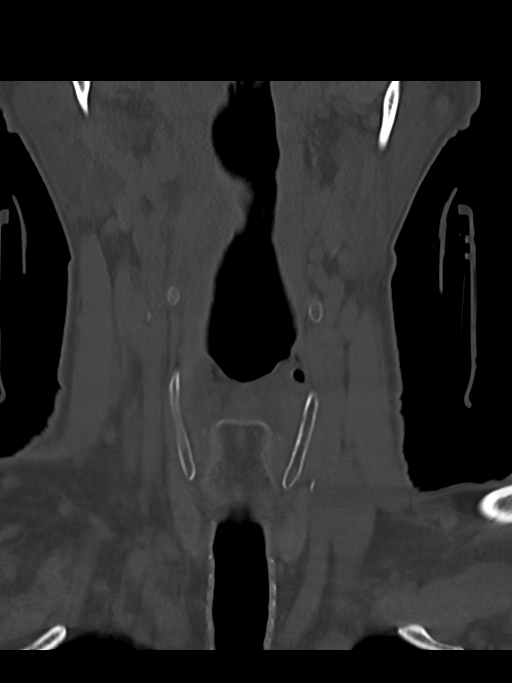
[im 25/61  bone]
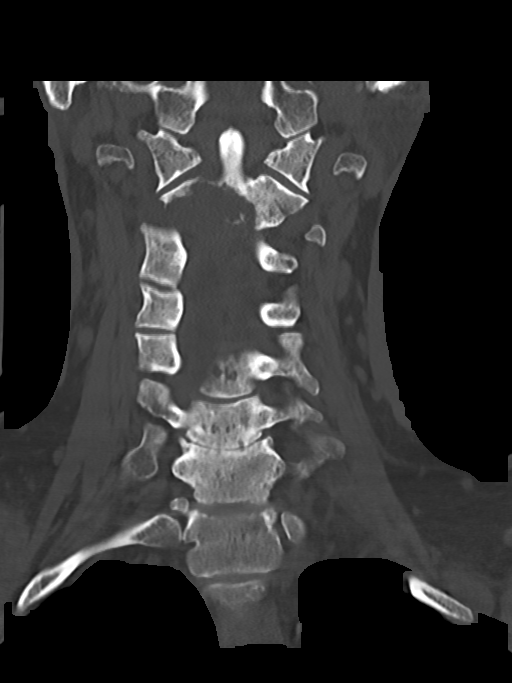
[im 37/61  bone]
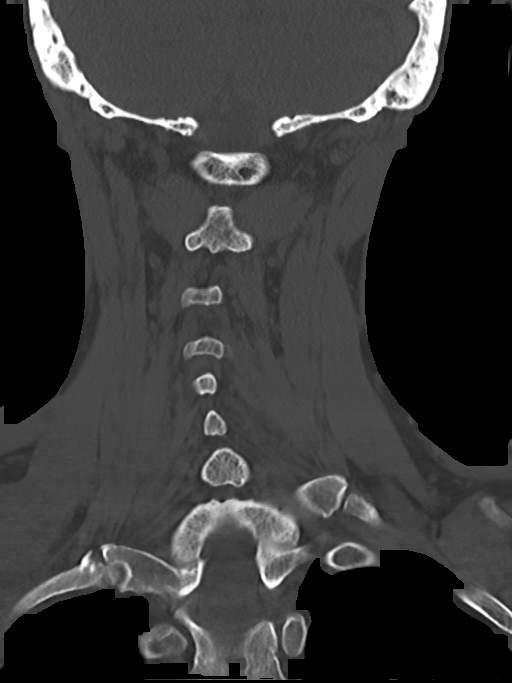

[Series 8: orthogonal axials · axial · 0.21mm/px · z∈[-241,-113]mm · 5 of 99 slices shown, 7 images]
[im 17/99  soft-tissue]
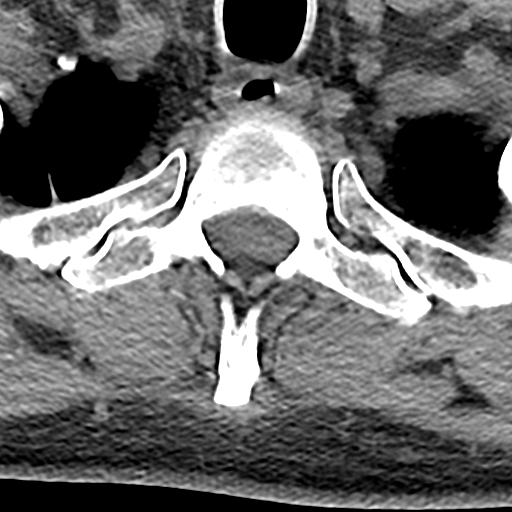
[im 17/99  bone]
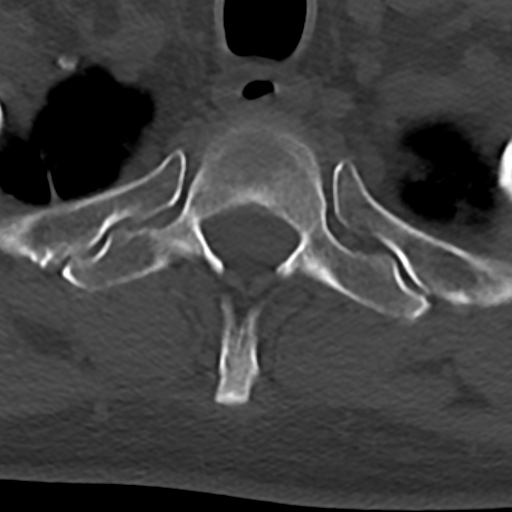
[im 33/99  bone]
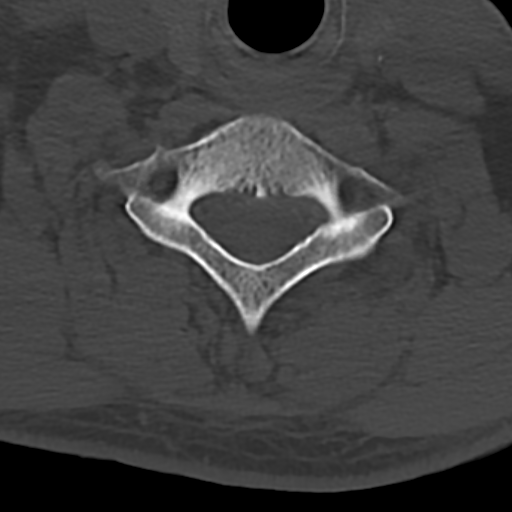
[im 50/99  bone]
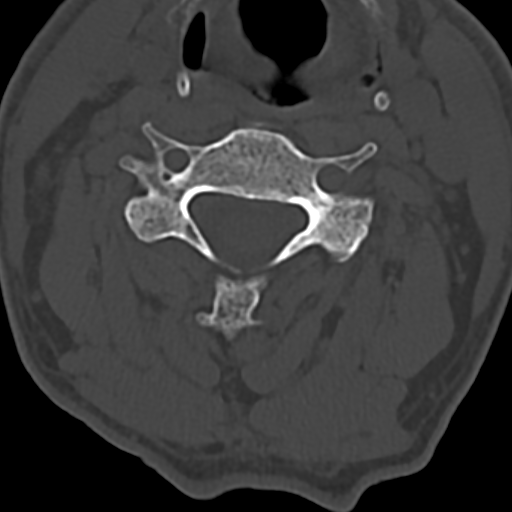
[im 66/99  bone]
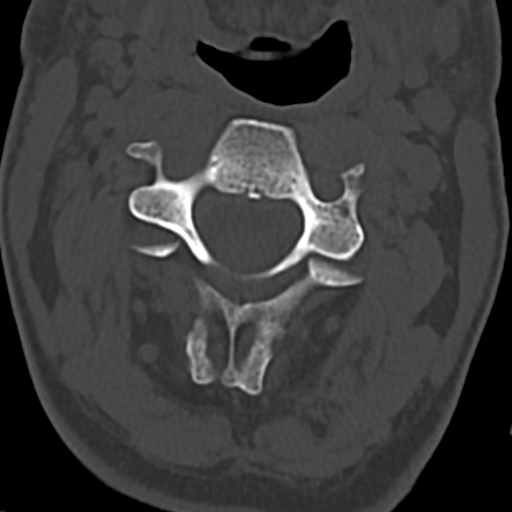
[im 82/99  soft-tissue]
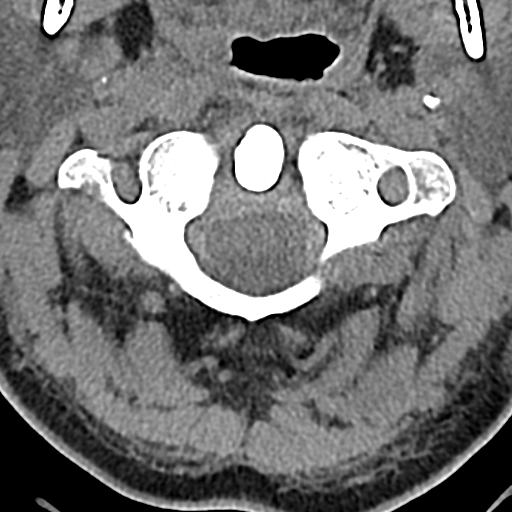
[im 82/99  bone]
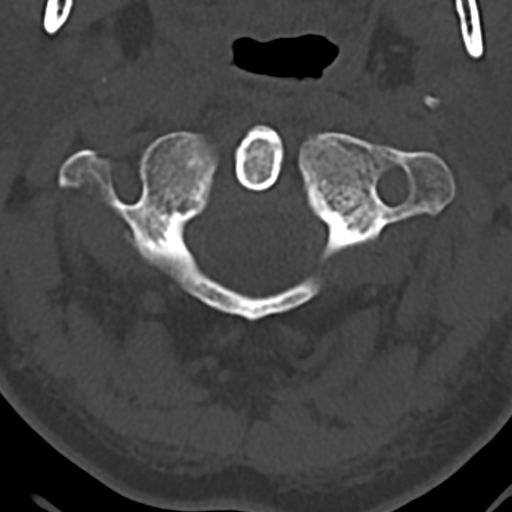

[13 of 33 positions shown; findings below may reference images not displayed]

FINDINGS: Alignment: There is grade 1 retrolisthesis at the C2-C3 level.
Alignment is otherwise normal.

Skull base and vertebrae: There is a destructive lesion of the lower
aspect of the C2 vertebral body that extends into the right-sided
posterior elements. On axial images, the soft tissue mass measures
3.3 cm transverse by 2.9 cm AP. There is retropulsion/epidural
extension as demonstrated on the concomitant MRI. The mass extends
into the right neural foramen there is also destruction of the right
posterosuperior corner of C3. No lower cervical vertebral lesions.

Soft tissues and spinal canal: There is soft tissue thickening
anterior to C2.

Disc levels: Disc space narrowing is greatest at C2-3 and C6-7. As
above, there is retropulsion at C2. This effaces the ventral thecal
sac and causes mild spinal canal stenosis.

Upper chest: Biapical emphysema.

Other: None
IMPRESSION: 1. Soft tissue mass of the lower aspect of C2, with associated
osseous destruction, most consistent with plasmacytoma or
metastasis. 4 mm of retropulsion/posterior epidural extension with
associated mild spinal canal stenosis, better characterized on
concomitant MRI. The mass extends into the right neural foramen.
2. Mild destruction of the posterosuperior corner of C3.
3. Biapical emphysema.  ([id number]).
